# Patient Record
Sex: Female | Born: 1980 | Race: Black or African American | Hispanic: No | Marital: Single | Smoking: Never smoker
Health system: Southern US, Community
[De-identification: ages and names within clinical notes are randomized; demographics above are authoritative.]

## PROBLEM LIST (undated history)

## (undated) DIAGNOSIS — D219 Benign neoplasm of connective and other soft tissue, unspecified: Secondary | ICD-10-CM

## (undated) DIAGNOSIS — L509 Urticaria, unspecified: Secondary | ICD-10-CM

## (undated) DIAGNOSIS — T783XXA Angioneurotic edema, initial encounter: Secondary | ICD-10-CM

## (undated) DIAGNOSIS — R51 Headache: Secondary | ICD-10-CM

## (undated) DIAGNOSIS — E282 Polycystic ovarian syndrome: Secondary | ICD-10-CM

## (undated) DIAGNOSIS — R519 Headache, unspecified: Secondary | ICD-10-CM

## (undated) DIAGNOSIS — N926 Irregular menstruation, unspecified: Secondary | ICD-10-CM

## (undated) DIAGNOSIS — B009 Herpesviral infection, unspecified: Secondary | ICD-10-CM

## (undated) HISTORY — DX: Urticaria, unspecified: L50.9

## (undated) HISTORY — DX: Angioneurotic edema, initial encounter: T78.3XXA

## (undated) HISTORY — PX: NO PAST SURGERIES: SHX2092

---

## 2004-08-19 ENCOUNTER — Inpatient Hospital Stay (HOSPITAL_COMMUNITY): Admission: AD | Admit: 2004-08-19 | Discharge: 2004-08-19 | Payer: Self-pay | Admitting: Family Medicine

## 2004-08-23 ENCOUNTER — Emergency Department (HOSPITAL_COMMUNITY): Admission: EM | Admit: 2004-08-23 | Discharge: 2004-08-23 | Payer: Self-pay | Admitting: Emergency Medicine

## 2004-11-06 ENCOUNTER — Emergency Department (HOSPITAL_COMMUNITY): Admission: EM | Admit: 2004-11-06 | Discharge: 2004-11-06 | Payer: Self-pay | Admitting: Emergency Medicine

## 2013-06-25 ENCOUNTER — Encounter (HOSPITAL_COMMUNITY): Payer: Self-pay | Admitting: Emergency Medicine

## 2013-06-25 ENCOUNTER — Emergency Department (HOSPITAL_COMMUNITY)
Admission: EM | Admit: 2013-06-25 | Discharge: 2013-06-25 | Disposition: A | Discharge status: Home / Self Care | Payer: Self-pay | Attending: Emergency Medicine | Admitting: Emergency Medicine

## 2013-06-25 DIAGNOSIS — R509 Fever, unspecified: Secondary | ICD-10-CM | POA: Insufficient documentation

## 2013-06-25 DIAGNOSIS — J3489 Other specified disorders of nose and nasal sinuses: Secondary | ICD-10-CM | POA: Insufficient documentation

## 2013-06-25 DIAGNOSIS — A5901 Trichomonal vulvovaginitis: Secondary | ICD-10-CM | POA: Insufficient documentation

## 2013-06-25 DIAGNOSIS — Z3202 Encounter for pregnancy test, result negative: Secondary | ICD-10-CM | POA: Insufficient documentation

## 2013-06-25 DIAGNOSIS — A5909 Other urogenital trichomoniasis: Secondary | ICD-10-CM

## 2013-06-25 DIAGNOSIS — M6281 Muscle weakness (generalized): Secondary | ICD-10-CM | POA: Insufficient documentation

## 2013-06-25 DIAGNOSIS — IMO0001 Reserved for inherently not codable concepts without codable children: Secondary | ICD-10-CM | POA: Insufficient documentation

## 2013-06-25 DIAGNOSIS — N949 Unspecified condition associated with female genital organs and menstrual cycle: Secondary | ICD-10-CM | POA: Insufficient documentation

## 2013-06-25 DIAGNOSIS — IMO0002 Reserved for concepts with insufficient information to code with codable children: Secondary | ICD-10-CM | POA: Insufficient documentation

## 2013-06-25 DIAGNOSIS — N938 Other specified abnormal uterine and vaginal bleeding: Secondary | ICD-10-CM

## 2013-06-25 DIAGNOSIS — N760 Acute vaginitis: Secondary | ICD-10-CM | POA: Insufficient documentation

## 2013-06-25 DIAGNOSIS — Z79899 Other long term (current) drug therapy: Secondary | ICD-10-CM | POA: Insufficient documentation

## 2013-06-25 LAB — COMPREHENSIVE METABOLIC PANEL
ALT: 8 U/L (ref 0–35)
AST: 17 U/L (ref 0–37)
BUN: 7 mg/dL (ref 6–23)
CO2: 28 mEq/L (ref 19–32)
Calcium: 8.9 mg/dL (ref 8.4–10.5)
Creatinine, Ser: 0.91 mg/dL (ref 0.50–1.10)
GFR calc Af Amer: >90 mL/min (ref >90)
GFR calc non Af Amer: 84 mL/min — ABNORMAL LOW (ref >90)
Glucose, Bld: 75 mg/dL (ref 70–99)
Sodium: 138 mEq/L (ref 135–145)
Total Protein: 7.1 g/dL (ref 6.0–8.3)

## 2013-06-25 LAB — CBC WITH DIFFERENTIAL/PLATELET
Basophils Absolute: 0 10*3/uL (ref 0.0–0.1)
Eosinophils Absolute: 0 10*3/uL (ref 0.0–0.7)
Eosinophils Relative: 1 % (ref 0–5)
HCT: 36.8 % (ref 36.0–46.0)
Hemoglobin: 12.4 g/dL (ref 12.0–15.0)
Lymphocytes Relative: 55 % — ABNORMAL HIGH (ref 12–46)
Lymphs Abs: 2.5 10*3/uL (ref 0.7–4.0)
MCH: 30.2 pg (ref 26.0–34.0)
MCV: 89.8 fL (ref 78.0–100.0)
Monocytes Absolute: 0.3 10*3/uL (ref 0.1–1.0)
Monocytes Relative: 7 % (ref 3–12)
Platelets: 219 10*3/uL (ref 150–400)
RBC: 4.1 MIL/uL (ref 3.87–5.11)
WBC: 4.6 10*3/uL (ref 4.0–10.5)

## 2013-06-25 LAB — URINALYSIS, ROUTINE W REFLEX MICROSCOPIC
Bilirubin Urine: NEGATIVE
Nitrite: NEGATIVE
Protein, ur: NEGATIVE mg/dL
Urobilinogen, UA: 1 mg/dL (ref 0.0–1.0)
pH: 6.5 (ref 5.0–8.0)

## 2013-06-25 LAB — WET PREP, GENITAL
Clue Cells Wet Prep HPF POC: NONE SEEN
Yeast Wet Prep HPF POC: NONE SEEN

## 2013-06-25 LAB — URINE MICROSCOPIC-ADD ON

## 2013-06-25 LAB — LIPASE, BLOOD: Lipase: 20 U/L (ref 11–59)

## 2013-06-25 MED ORDER — METRONIDAZOLE 500 MG PO TABS
2000.0000 mg | ORAL_TABLET | Freq: Once | ORAL | Status: AC
  Administered 2013-06-25: 2000 mg via ORAL
  Filled 2013-06-25: qty 4

## 2013-06-25 MED ORDER — NORGESTIMATE-ETH ESTRADIOL 0.25-35 MG-MCG PO TABS: 1.0000 | ORAL_TABLET | Freq: Every day | ORAL | Status: DC

## 2013-06-25 NOTE — ED Notes (Addendum)
Pt began menstruating 3 weeks ago and continues to have vaginal bleeding.  Three days ago she experienced nausea, vomiting, diarrhea and chills.  She no longer has those s/s, but she is feeling dizzy and weak.  Pt also c/o lower abdominal pain she describes as cramps.

## 2013-06-25 NOTE — ED Notes (Signed)
Pt discharged.Vital signs stable and GCS 15 and discharge instruction given. 

## 2013-06-25 NOTE — ED Provider Notes (Signed)
CSN: [id number]     Arrival date & time 06/25/13  1201 History   First MD Initiated Contact with Patient 06/25/13 1526     Chief Complaint  Patient presents with  . Dizziness  . Vaginal Bleeding   (Consider location/radiation/quality/duration/timing/severity/associated sxs/prior Treatment) Patient is a 32 y.o. female presenting with vaginal bleeding.  Vaginal Bleeding Associated symptoms: dizziness and fever   Associated symptoms: no abdominal pain and no dysuria     32 y/o female here with about 3 weeks of vaginal bleeding and weakness and dizziness for 4 days. She states that her bleeding began a bit lighter occurring one to 2 pads per day and has worsened to about 4 pads per day currently. She usually has regular periods every 4 weeks lasting 4-5 days requiring only 1-2 tabs daily. She denies any trauma that may have led to this. She has dyspareunia at baseline.  She states about 4 days ago she began to develop congestion, body aches, subjective fever, intermittent dizziness, and diffuse weakness. She describes her dizziness as room spinning for moments but self resolve. There is no obvious aggravating factors such as turning her head or standing. She has had more symptoms when she is in the shower. Other symptoms have improved drastically in the last day.    History reviewed. No pertinent past medical history. History reviewed. No pertinent past surgical history. No family history on file. History  Substance Use Topics  . Smoking status: Never Smoker   . Smokeless tobacco: Not on file  . Alcohol Use: Yes     Comment: once a month   OB History   Grav Para Term Preterm Abortions TAB SAB Ect Mult Living                 Review of Systems  Constitutional: Positive for fever and chills. Negative for appetite change.  HENT: Positive for congestion. Negative for sore throat.   Respiratory: Negative for cough and shortness of breath.   Cardiovascular: Negative for chest pain.   Gastrointestinal: Negative for abdominal pain.  Genitourinary: Positive for vaginal bleeding and menstrual problem. Negative for dysuria.  Musculoskeletal: Positive for arthralgias and myalgias.  Neurological: Positive for dizziness and headaches.  All other systems reviewed and are negative.    Allergies  Review of patient's allergies indicates no known allergies.  Home Medications   Current Outpatient Rx  Name  Route  Sig  Dispense  Refill  . Pseudoeph-Doxylamine-DM-APAP (NYQUIL PO)   Oral   Take 30 mLs by mouth at bedtime as needed (cold).         . norgestimate-ethinyl estradiol (SPRINTEC 28) 0.25-35 MG-MCG tablet   Oral   Take 1 tablet by mouth daily.   1 Package   2    BP 109/63  Pulse 79  Temp(Src) 97.9 F (36.6 C) (Oral)  Resp 18  SpO2 100%  LMP 06/07/2013 Physical Exam  Nursing note and vitals reviewed. Constitutional: She is oriented to person, place, and time. She appears well-developed and well-nourished. No distress.  HENT:  Head: Normocephalic and atraumatic.  Eyes: EOM are normal. Pupils are equal, round, and reactive to light.  Neck: Neck supple.  Cardiovascular: Normal rate, regular rhythm and normal heart sounds.   No murmur heard. Pulmonary/Chest: Effort normal and breath sounds normal.  Abdominal: Soft. Bowel sounds are normal. There is no tenderness. There is no guarding.  Genitourinary: Cervix exhibits discharge. Cervix exhibits no motion tenderness. Right adnexum displays no mass, no tenderness and no  fullness. Left adnexum displays no mass, no tenderness and no fullness. No signs of injury around the vagina. No vaginal discharge found.  Musculoskeletal: She exhibits no edema.  Neurological: She is alert and oriented to person, place, and time.  Skin: Skin is warm and dry. She is not diaphoretic.  Psychiatric: She has a normal mood and affect.    ED Course  Procedures (including critical care time) Labs Review Labs Reviewed  WET PREP,  GENITAL - Abnormal; Notable for the following:    Trich, Wet Prep MODERATE (*)    WBC, Wet Prep HPF POC MODERATE (*)    All other components within normal limits  CBC WITH DIFFERENTIAL - Abnormal; Notable for the following:    Neutrophils Relative % 37 (*)    Lymphocytes Relative 55 (*)    All other components within normal limits  COMPREHENSIVE METABOLIC PANEL - Abnormal; Notable for the following:    GFR calc non Af Amer 84 (*)    All other components within normal limits  URINALYSIS, ROUTINE W REFLEX MICROSCOPIC - Abnormal; Notable for the following:    APPearance CLOUDY (*)    Specific Gravity, Urine 1.033 (*)    Hgb urine dipstick LARGE (*)    Leukocytes, UA TRACE (*)    All other components within normal limits  URINE MICROSCOPIC-ADD ON - Abnormal; Notable for the following:    Squamous Epithelial / LPF FEW (*)    Bacteria, UA FEW (*)    All other components within normal limits  URINE CULTURE  GC/CHLAMYDIA PROBE AMP  LIPASE, BLOOD  RPR  HIV ANTIBODY (ROUTINE TESTING)  POCT PREGNANCY, URINE   Imaging Review No results found.  EKG Interpretation   None       MDM   1. Trichomonal cervicitis   2. DUB (dysfunctional uterine bleeding)    31 year old female here with vaginal bleeding and flulike symptoms. Her vaginal bleeding is stable around 4 pads per day hemoglobin is 12.4. She states her flulike symptoms or are rapidly resolving he also, doing much better today than yesterday.   Evaluated with CBC, CMP, UA, lipase, wet prep and GC C. Also had an HIV and RPR for concerns of ST  Wet prep Significant for trichomonas, treated with 2 g of Flagyl here, advised treating partner also.  Started Sprintec to help control her moderate vaginal bleeding and recommended following up with GYN.  Red flags reviewed, return for worsening symptoms  Murtis Sink, MD [facility] Family Medicine Resident, PGY-2 06/25/2013, 6:42 PM         Elenora Gamma, MD 06/25/13  [phone]

## 2013-06-26 LAB — GC/CHLAMYDIA PROBE AMP
CT Probe RNA: NEGATIVE
GC Probe RNA: NEGATIVE

## 2013-06-26 LAB — HIV ANTIBODY (ROUTINE TESTING W REFLEX): HIV: NONREACTIVE

## 2013-06-27 LAB — URINE CULTURE: Colony Count: >=100000

## 2013-06-28 NOTE — Progress Notes (Signed)
ED Antimicrobial Stewardship Positive Culture Follow Up   Gabriella Smith is an 32 y.o. female who presented to [facility] on 06/25/2013 with a chief complaint of  Chief Complaint  Patient presents with  . Dizziness  . Vaginal Bleeding    Recent Results (from the past 720 hour(s))  URINE CULTURE     Status: None   Collection Time    06/25/13 12:26 PM      Result Value Range Status   Specimen Description URINE, CLEAN CATCH   Final   Special Requests NONE   Final   Culture  Setup Time     Final   Value: 06/25/2013 17:03     Performed at [facility] Count     Final   Value: >=100,000 COLONIES/ML     Performed at [facility]   Culture     Final   Value: CITROBACTER KOSERI     Performed at [facility]   Report Status 06/27/2013 FINAL   Final   Organism ID, Bacteria CITROBACTER KOSERI   Final    []  Treated with , organism resistant to prescribed antimicrobial [x]  Patient discharged originally without antimicrobial agent and treatment is now indicated  New antibiotic prescription: Ciprofloxacin 500mg  PO BID x 3 days  ED Provider: Arthor Captain, PA-C   Cleon Dew 06/28/2013, 4:12 PM Infectious Diseases Pharmacist Phone# [phone]

## 2013-07-03 NOTE — ED Provider Notes (Signed)
I saw and evaluated the patient, reviewed the resident's note and I agree with the findings and plan.   .Face to face Exam:  General:  Awake HEENT:  Atraumatic Resp:  Normal effort Abd:  Nondistended Neuro:No focal weakness  Leelah Hanna L Ladeidra Borys, MD 07/03/13 0710 

## 2013-07-07 ENCOUNTER — Encounter (HOSPITAL_COMMUNITY): Payer: Self-pay | Admitting: Emergency Medicine

## 2013-07-07 ENCOUNTER — Emergency Department (HOSPITAL_COMMUNITY)
Admission: EM | Admit: 2013-07-07 | Discharge: 2013-07-07 | Disposition: A | Discharge status: Home / Self Care | Payer: Self-pay | Attending: Emergency Medicine | Admitting: Emergency Medicine

## 2013-07-07 DIAGNOSIS — R21 Rash and other nonspecific skin eruption: Secondary | ICD-10-CM | POA: Insufficient documentation

## 2013-07-07 DIAGNOSIS — Z79899 Other long term (current) drug therapy: Secondary | ICD-10-CM | POA: Insufficient documentation

## 2013-07-07 DIAGNOSIS — W57XXXA Bitten or stung by nonvenomous insect and other nonvenomous arthropods, initial encounter: Secondary | ICD-10-CM

## 2013-07-07 NOTE — ED Provider Notes (Signed)
CSN: [id number]     Arrival date & time 07/07/13  1859 History  This chart was scribed for Gabriella Helper, PA, working with Donnetta Hutching, MD, by [facility] ED Scribe. This patient was seen in room WTR7/WTR7 and the patient's care was started at 7:31 PM.   Chief Complaint  Patient presents with  . Rash    The history is provided by the patient. No language interpreter was used.    HPI Comments: Gabriella Smith is a 32 y.o. female who presents to the Emergency Department complaining of a gradually worsening, itchy rash onset 2 days ago. She states that she stayed at a hotel the night before the rash onset. She states that the rash began on her left forearm and has gradually spread throughout her body. She states that she stayed in the hotel with another individual who did not develop a similar rash. She also reports using a new soap recently. She states that she has taken Benadryl and has applied Hydrocortisone without relief of her rash. She denies throat swelling, chest pain, trouble breathing or any other symptoms.  History reviewed. No pertinent past medical history. History reviewed. No pertinent past surgical history. No family history on file. History  Substance Use Topics  . Smoking status: Never Smoker   . Smokeless tobacco: Not on file  . Alcohol Use: Yes     Comment: once a month   OB History   Grav Para Term Preterm Abortions TAB SAB Ect Mult Living                 Review of Systems  HENT:       Denies throat swelling.  Cardiovascular: Negative for chest pain.  All other systems reviewed and are negative.   Allergies  Review of patient's allergies indicates no known allergies.  Home Medications   Current Outpatient Rx  Name  Route  Sig  Dispense  Refill  . norgestimate-ethinyl estradiol (SPRINTEC 28) 0.25-35 MG-MCG tablet   Oral   Take 1 tablet by mouth daily.   1 Package   2   . Pseudoeph-Doxylamine-DM-APAP (NYQUIL PO)   Oral   Take 30 mLs by mouth at bedtime  as needed (cold).          Triage Vitals: BP 96/66  Pulse 63  Temp(Src) 98.4 F (36.9 C) (Oral)  Resp 14  Ht 5\' 6"  (1.676 m)  Wt 152 lb (68.947 kg)  BMI 24.55 kg/m2  SpO2 100%  LMP 06/11/2013  Physical Exam  Nursing note and vitals reviewed. Constitutional: She is oriented to person, place, and time. She appears well-developed and well-nourished. No distress.  HENT:  Head: Normocephalic and atraumatic.  Normal throat exam.  Eyes: EOM are normal.  Neck: Neck supple. No tracheal deviation present.  Cardiovascular: Normal rate.   Pulmonary/Chest: Effort normal and breath sounds normal. No respiratory distress. She has no wheezes. She has no rales.  Lungs clear to auscultation.  Musculoskeletal: Normal range of motion.  Neurological: She is alert and oriented to person, place, and time.  Skin: Skin is warm and dry. Rash noted.  Pt has small, erythematous round lesions with small punctated scabs noted on left forearm, upper back, left side of neck and left shoulder. Rash is blanchable. No vesicular, pustular or petechial lesions. No rash on the palms of hands or the web spaces of fingers.  Psychiatric: She has a normal mood and affect. Her behavior is normal.    ED Course  Procedures (  including critical care time)  DIAGNOSTIC STUDIES: Oxygen Saturation is 100% on RA, normal by my interpretation.    COORDINATION OF CARE: 7:37 PM- Discussed clinical suspicion of bed bugs vs. Scabies. Pt advised of plan for treatment and pt agrees.  Labs Review Labs Reviewed - No data to display Imaging Review No results found.  EKG Interpretation   None       MDM  No diagnosis found. Patient examined by Dr. Adriana Simas       No clinical evidence of scabies or cellulitis. No antibiotics necessary at this point.  I personally performed the services described in this documentation, which was scribed in my presence. The recorded information has Smith reviewed and is accurate. Medical screening  examination/treatment/procedure(s) were conducted as a shared visit with non-physician practitioner(s) and myself.  I personally evaluated the patient during the encounter.  EKG Interpretation   None         Donnetta Hutching, MD 07/07/13 2051

## 2013-07-07 NOTE — ED Notes (Signed)
Pt states slept at a hotel 2 nights ago, the next day noticed rash to forehead, small patches on both arms, and on bottom. Pt states rash is itchy, but not painful.

## 2013-09-25 ENCOUNTER — Encounter (HOSPITAL_COMMUNITY): Payer: Self-pay

## 2013-09-25 ENCOUNTER — Inpatient Hospital Stay (HOSPITAL_COMMUNITY)
Admission: AD | Admit: 2013-09-25 | Discharge: 2013-09-25 | Disposition: A | Discharge status: Home / Self Care | Payer: Self-pay | Source: Ambulatory Visit | Attending: Obstetrics & Gynecology | Admitting: Obstetrics & Gynecology

## 2013-09-25 DIAGNOSIS — N390 Urinary tract infection, site not specified: Secondary | ICD-10-CM | POA: Insufficient documentation

## 2013-09-25 DIAGNOSIS — Z8742 Personal history of other diseases of the female genital tract: Secondary | ICD-10-CM

## 2013-09-25 DIAGNOSIS — E282 Polycystic ovarian syndrome: Secondary | ICD-10-CM | POA: Insufficient documentation

## 2013-09-25 DIAGNOSIS — N854 Malposition of uterus: Secondary | ICD-10-CM | POA: Insufficient documentation

## 2013-09-25 DIAGNOSIS — N911 Secondary amenorrhea: Secondary | ICD-10-CM

## 2013-09-25 DIAGNOSIS — N912 Amenorrhea, unspecified: Secondary | ICD-10-CM | POA: Insufficient documentation

## 2013-09-25 LAB — WET PREP, GENITAL
Trich, Wet Prep: NONE SEEN
Yeast Wet Prep HPF POC: NONE SEEN

## 2013-09-25 LAB — URINE MICROSCOPIC-ADD ON

## 2013-09-25 LAB — POCT PREGNANCY, URINE: Preg Test, Ur: NEGATIVE

## 2013-09-25 LAB — URINALYSIS, ROUTINE W REFLEX MICROSCOPIC
Bilirubin Urine: NEGATIVE
Glucose, UA: NEGATIVE mg/dL
Ketones, ur: NEGATIVE mg/dL
NITRITE: NEGATIVE
PH: 6.5 (ref 5.0–8.0)
Protein, ur: 100 mg/dL — AB
Specific Gravity, Urine: >1.03 — ABNORMAL HIGH (ref 1.005–1.030)
Urobilinogen, UA: 0.2 mg/dL (ref 0.0–1.0)

## 2013-09-25 MED ORDER — CIPROFLOXACIN HCL 500 MG PO TABS: 500.0000 mg | ORAL_TABLET | Freq: Two times a day (BID) | ORAL | Status: DC

## 2013-09-25 NOTE — Discharge Instructions (Signed)
Antibiotic Medication Antibiotics are among the most frequently prescribed medicines. Antibiotics cure illness by assisting our body to injure or kill the bacteria that cause infection. While antibiotics are useful to treat a wide variety of infections they are useless against viruses. Antibiotics cannot cure colds, flu, or other viral infections.  There are many types of antibiotics available. Your caregiver will decide which antibiotic will be useful for an illness. Never take or give someone else's antibiotics or left over medicine. Your caregiver may also take into account:  Allergies.  The cost of the medicine.  Dosing schedules.  Taste.  Common side effects when choosing an antibiotic for an infection. Ask your caregiver if you have questions about why a certain medicine was chosen. HOME CARE INSTRUCTIONS Read all instructions and labels on medicine bottles carefully. Some antibiotics should be taken on an empty stomach while others should be taken with food. Taking antibiotics incorrectly may reduce how well they work. Some antibiotics need to be kept in the refrigerator. Others should be kept at room temperature. Ask your caregiver or pharmacist if you do not understand how to give the medicine. Be sure to give the amount of medicine your caregiver has prescribed. Even if you feel better and your symptoms improve, bacteria may still remain alive in the body. Taking all of the medicine will prevent:  The infection from returning and becoming harder to treat.  Complications from partially treated infections. If there is any medicine left over after you have taken the medicine as your caregiver has instructed, throw the medicine away. Be sure to tell your caregiver if you:  Are allergic to any medicines.  Are pregnant or intend to become pregnant while using this medicine.  Are breastfeeding.  Are taking any other prescription, non-prescription medicine, or herbal  remedies.  Have any other medical conditions or problems you have not already discussed. If you are taking birth control pills, they may not work while you are on antibiotics. To avoid unwanted pregnancy:  Continue taking your birth control pills as usual.  Use a second form of birth control (such as condoms) while you are taking antibiotic medicine.  When you finish taking the antibiotic medicine, continue using the second form of birth control until you are finished with your current 1 month cycle of birth control pills. Try not to miss any doses of medicine. If you miss a dose, take it as soon as possible. However, if it is almost time for the next dose and the dosing schedule is:  2 doses a day, take the missed dose and the next dose 5 to 6 hours apart.  3 or more doses a day, take the missed dose and the next dose 2 to 4 hours apart, then go back to the normal schedule.  If you are unable to make up a missed dose, take the next scheduled dose on time and complete the missed dose at the end of the prescribed time for your medicine. SIDE EFFECTS TO TAKING ANTIBIOTICS Common side effects to antibiotic use include:  Soft stools or diarrhea.  Mild stomach upset.  Sun sensitivity. SEEK MEDICAL CARE IF:   If you get worse or do not improve within a few days of starting the medicine.  Vomiting develops.  Diaper rash or rash on the genitals appears.  Vaginal itching occurs.  White patches appear on the tongue or in the mouth.  Severe watery diarrhea and abdominal cramps occur.  Signs of an allergy develop (hives, unknown  itchy rash appears). STOP TAKING THE ANTIBIOTIC. SEEK IMMEDIATE MEDICAL CARE IF:   Urine turns dark or blood colored.  Skin turns yellow.  Easy bruising or bleeding occurs.  Joint pain or muscle aches occur.  Fever returns.  Severe headache occurs.  Signs of an allergy develop (trouble breathing, wheezing, swelling of the lips, face or tongue,  fainting, or blisters on the skin or in the mouth). STOP TAKING THE ANTIBIOTIC. Document Released: 04/03/2004 Document Revised: 10/14/2011 Document Reviewed: 04/13/2009 [facility] Patient Information 2014 [place]. Secondary Amenorrhea  Secondary amenorrhea is the stopping of menstrual flow for 3 6 months in a female who has previously had periods. There are many possible causes. Most of these causes are not serious. Usually, treating the underlying problem causing the loss of menses will return your periods to normal. CAUSES  Some common and uncommon causes of not menstruating include:  Malnutrition.  Low blood sugar (hypoglycemia).  Polycystic ovary disease.  Stress or fear.  Breastfeeding.  Hormone imbalance.  Ovarian failure.  Medicines.  Extreme obesity.  Cystic fibrosis.  Low body weight or drastic weight reduction from any cause.  Early menopause.  Removal of ovaries or uterus.  Contraceptives.  Illness.  Long-term (chronic) illnesses.  Cushing syndrome.  Thyroid problems.  Birth control pills, patches, or vaginal rings for birth control. RISK FACTORS You may be at greater risk of secondary amenorrhea if:  You have a family history of this condition.  You have an eating disorder.  You do athletic training. DIAGNOSIS  A diagnosis is made by your health care provider taking a medical history and doing a physical exam. This will include a pelvic exam to check for problems with your reproductive organs. Pregnancy must be ruled out. Often, numerous blood tests are done to measure different hormones in the body. Urine testing may be done. Specialized exams (ultrasound, CT scan, MRI, or hysteroscopy) may have to be done as well as measuring the body mass index (BMI). TREATMENT  Treatment depends on the cause of the amenorrhea. If an eating disorder is present, this can be treated with an adequate diet and therapy. Chronic illnesses may improve with  treatment of the illness. Amenorrhea may be corrected with medicines, lifestyle changes, or surgery. If the amenorrhea cannot be corrected, it is sometimes possible to create a false menstruation with medicines. HOME CARE INSTRUCTIONS  Maintain a healthy diet.  Manage weight problems.  Exercise regularly but not excessively.  Get adequate sleep.  Manage stress.  Be aware of changes in your menstrual cycle. Keep a record of when your periods occur. Note the date your period starts, how long it lasts, and any problems. SEEK MEDICAL CARE IF: Your symptoms do not get better with treatment. Document Released: 09/02/2006 Document Revised: 03/24/2013 Document Reviewed: 01/07/2013 [facility] Patient Information 2014 [place], [place]. Urinary Tract Infection Urinary tract infections (UTIs) can develop anywhere along your urinary tract. Your urinary tract is your body's drainage system for removing wastes and extra water. Your urinary tract includes two kidneys, two ureters, a bladder, and a urethra. Your kidneys are a pair of bean-shaped organs. Each kidney is about the size of your fist. They are located below your ribs, one on each side of your spine. CAUSES Infections are caused by microbes, which are microscopic organisms, including fungi, viruses, and bacteria. These organisms are so small that they can only be seen through a microscope. Bacteria are the microbes that most commonly cause UTIs. SYMPTOMS  Symptoms of UTIs may vary  by age and gender of the patient and by the location of the infection. Symptoms in young women typically include a frequent and intense urge to urinate and a painful, burning feeling in the bladder or urethra during urination. Older women and men are more likely to be tired, shaky, and weak and have muscle aches and abdominal pain. A fever may mean the infection is in your kidneys. Other symptoms of a kidney infection include pain in your back or sides below the ribs, nausea,  and vomiting. DIAGNOSIS To diagnose a UTI, your caregiver will ask you about your symptoms. Your caregiver also will ask to provide a urine sample. The urine sample will be tested for bacteria and white blood cells. White blood cells are made by your body to help fight infection. TREATMENT  Typically, UTIs can be treated with medication. Because most UTIs are caused by a bacterial infection, they usually can be treated with the use of antibiotics. The choice of antibiotic and length of treatment depend on your symptoms and the type of bacteria causing your infection. HOME CARE INSTRUCTIONS  If you were prescribed antibiotics, take them exactly as your caregiver instructs you. Finish the medication even if you feel better after you have only taken some of the medication.  Drink enough water and fluids to keep your urine clear or pale yellow.  Avoid caffeine, tea, and carbonated beverages. They tend to irritate your bladder.  Empty your bladder often. Avoid holding urine for long periods of time.  Empty your bladder before and after sexual intercourse.  After a bowel movement, women should cleanse from front to back. Use each tissue only once. SEEK MEDICAL CARE IF:   You have back pain.  You develop a fever.  Your symptoms do not begin to resolve within 3 days. SEEK IMMEDIATE MEDICAL CARE IF:   You have severe back pain or lower abdominal pain.  You develop chills.  You have nausea or vomiting.  You have continued burning or discomfort with urination. MAKE SURE YOU:   Understand these instructions.  Will watch your condition.  Will get help right away if you are not doing well or get worse. Document Released: 05/01/2005 Document Revised: 01/21/2012 Document Reviewed: 08/30/2011 [facility] Patient Information 2014 [place].

## 2013-09-25 NOTE — MAU Note (Signed)
Pt presents with concerns because she has not had a period since December. Pt complains of constant pain in lower abdomen since then. States she does not have an OB/gyn and wanted to get checked.

## 2013-09-25 NOTE — MAU Provider Note (Signed)
CC: No menses   First Provider Initiated Contact with Patient 09/25/13 1324      HPI Gabriella Smith is a 33 y.o. G0P0 who presents with initial episode of amenorrhea since 07/22/2013. She also describes pressure and fullness in lower abdomen but no abdominal pain. States she had late menarche about age 14 and regular periods until November when she had a particularly lengthy and heavy period that lasted 2 weeks and required about 4 pads per day. She was seen at [facility] emergency department for vaginal bleeding and dizziness 06/25/13. Hemoglobin was 12.4. GC and Chlamydia were negative. RPR and HIV were nonreactive. She was treated for trichomoniasis vaginitis and UTI citrobacter pansensitive tx'd with Cipro). She was given prescription for Sprintec but never filled it and does not want to take OCPs. The menses in December was normal timing, duration and flow. 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 She then went to a gynecologist in [place] in 2012 and had an ultrasound showing polycystic ovaries. Uses condoms for contraception. She's been under stress lately with her job and being in the process of moving.    History reviewed. No pertinent past medical history.  OB History  Gravida Para Term Preterm AB SAB TAB Ectopic Multiple Living  0 0                History reviewed. No pertinent past surgical history.  History   Social History  . Marital Status: Single    Spouse Name: N/A    Number of Children: N/A  . Years of Education: N/A   Occupational History  . Not on file.   Social History Main Topics  . Smoking status: Never Smoker   . Smokeless tobacco: Not on file  . Alcohol Use: Yes     Comment: once a month  . Drug Use: No  . Sexual Activity: Yes    Birth Control/ Protection: Condom   Other Topics Concern  . Not on file   Social History Narrative  . No narrative on file    No current  facility-administered medications on file prior to encounter.   Current Outpatient Prescriptions on File Prior to Encounter  Medication Sig Dispense Refill  . ciprofloxacin (CIPRO) 500 MG tablet Take 500 mg by mouth 2 (two) times daily. For 3 days. Started on 07/05/13      . diphenhydrAMINE (BENADRYL) 25 mg capsule Take 25 mg by mouth every 6 (six) hours as needed for itching.      . norgestimate-ethinyl estradiol (SPRINTEC 28) 0.25-35 MG-MCG tablet Take 1 tablet by mouth daily.  1 Package  2  . Pseudoeph-Doxylamine-DM-APAP (NYQUIL PO) Take 30 mLs by mouth at bedtime as needed (cold).        No Known Allergies  ROS Pertinent items in HPI. Denies candidate for vaginal discharge. Denies dysuria, hematuria, frequency or urgency of urination.  PHYSICAL EXAM Filed Vitals:   09/25/13 1319  BP: 110/64  Pulse: 72  Temp: 98.5 F (36.9 C)  Resp: 18   General: Well nourished, well developed female in no acute distress Skin: facial hirsutism post depilation tx Cardiovascular: Normal rate Respiratory: Normal effort Abdomen: Soft, nontender Back: No CVAT Extremities: No edema Neurologic: Alert and oriented Speculum exam: NEFG; vagina with physiologic discharge, no blood; cervix clean Bimanual exam: cervix closed, no CMT; uterus not enlarged, mid to retroverted; no adnexal tenderness or masses   LAB RESULTS Results  for orders placed during the hospital encounter of 09/25/13 (from the past 24 hour(s))  URINALYSIS, ROUTINE W REFLEX MICROSCOPIC     Status: Abnormal   Collection Time    09/25/13  1:07 PM      Result Value Ref Range   Color, Urine YELLOW  YELLOW   APPearance CLOUDY (*) CLEAR   Specific Gravity, Urine >1.030 (*) 1.005 - 1.030   pH 6.5  5.0 - 8.0   Glucose, UA NEGATIVE  NEGATIVE mg/dL   Hgb urine dipstick LARGE (*) NEGATIVE   Bilirubin Urine NEGATIVE  NEGATIVE   Ketones, ur NEGATIVE  NEGATIVE mg/dL   Protein, ur 100 (*) NEGATIVE mg/dL   Urobilinogen, UA 0.2  0.0 - 1.0  mg/dL   Nitrite NEGATIVE  NEGATIVE   Leukocytes, UA MODERATE (*) NEGATIVE  URINE MICROSCOPIC-ADD ON     Status: Abnormal   Collection Time    09/25/13  1:07 PM      Result Value Ref Range   Squamous Epithelial / LPF FEW (*) RARE   WBC, UA TOO NUMEROUS TO COUNT  <3 WBC/hpf   RBC / HPF TOO NUMEROUS TO COUNT  <3 RBC/hpf   Bacteria, UA MANY (*) RARE  POCT PREGNANCY, URINE     Status: None   Collection Time    09/25/13  1:15 PM      Result Value Ref Range   Preg Test, Ur NEGATIVE  NEGATIVE  WET PREP, GENITAL     Status: Abnormal   Collection Time    09/25/13  1:53 PM      Result Value Ref Range   Yeast Wet Prep HPF POC NONE SEEN  NONE SEEN   Trich, Wet Prep NONE SEEN  NONE SEEN   Clue Cells Wet Prep HPF POC FEW (*) NONE SEEN   WBC, Wet Prep HPF POC FEW (*) NONE SEEN    IMAGING No results found.  MAU COURSE C/W Dr. Maylene Roes Smith> will get TSH, [facility] today, to review at [facility] apponitment GC/CT sent; urine culture sent  ASSESSMENT  1. Secondary amenorrhea   2. History of PCOS   3. UTI (lower urinary tract infection)     PLAN Discharge home. See AVS for patient education. ROI 2012 [place] > result to [place] menstrual calendar.    Medication List    STOP taking these medications       norgestimate-ethinyl estradiol 0.25-35 MG-MCG tablet  Commonly known as:  SPRINTEC 28      TAKE these medications       ciprofloxacin 500 MG tablet  Commonly known as:  CIPRO  Take 1 tablet (500 mg total) by mouth 2 (two) times daily.        Follow-up Information   Follow up with [facility]. (Someone from July and clinic will call you with her and)    Specialty:  Obstetrics and Gynecology   Contact information:   [place] [address] [phone]       Lorene Dy, CNM 09/25/2013 1:25 PM

## 2013-09-25 NOTE — MAU Provider Note (Signed)
Attestation of Attending Supervision of Advanced Practitioner (CNM/NP): Evaluation and management procedures were performed by the Advanced Practitioner under my supervision and collaboration.  I have reviewed the Advanced Practitioner's note and chart, and I agree with the management and plan.  HARRAWAY-SMITH, Cherysh Epperly 4:10 PM

## 2013-09-26 LAB — TSH: TSH: 0.591 u[IU]/mL (ref 0.350–4.500)

## 2013-09-26 LAB — FOLLICLE STIMULATING HORMONE: FSH: 7.4 m[IU]/mL

## 2013-09-27 LAB — GC/CHLAMYDIA PROBE AMP
CT Probe RNA: NEGATIVE
GC Probe RNA: NEGATIVE

## 2013-11-01 ENCOUNTER — Telehealth: Payer: Self-pay | Admitting: General Practice

## 2013-11-01 ENCOUNTER — Encounter: Payer: Self-pay | Admitting: General Practice

## 2013-11-01 ENCOUNTER — Encounter: Payer: Self-pay | Admitting: Obstetrics & Gynecology

## 2013-11-01 NOTE — Telephone Encounter (Signed)
Patient no showed for appt today. Called patient, no answer- left message stating we see she missed her appt with [place] today, if she would like to reschedule please call the front office in order to do so. Will send patient letter

## 2013-11-15 ENCOUNTER — Telehealth: Payer: Self-pay

## 2013-11-15 ENCOUNTER — Encounter: Payer: Self-pay | Admitting: Obstetrics & Gynecology

## 2013-11-15 NOTE — Telephone Encounter (Signed)
Called pt. As she missed her appointment with Dr. Hulan Fray today. Pt. Stated she was called into work but was going to call to re-schedule. Pt. States her schedule is complications, advised pt. To call front desk when she has her schedule in hand so that she can schedule an appointment that works best for her but informed her the appointment may not be until the middle of may. Pt. Stated that was OK and that she will call to re-schedule soon. NO other questions or concerns to address.

## 2013-12-29 ENCOUNTER — Encounter: Payer: Self-pay | Admitting: General Practice

## 2013-12-31 ENCOUNTER — Encounter: Payer: Self-pay | Admitting: General Practice

## 2017-08-03 ENCOUNTER — Emergency Department (HOSPITAL_BASED_OUTPATIENT_CLINIC_OR_DEPARTMENT_OTHER): Payer: Self-pay

## 2017-08-03 ENCOUNTER — Other Ambulatory Visit: Payer: Self-pay

## 2017-08-03 ENCOUNTER — Encounter (HOSPITAL_BASED_OUTPATIENT_CLINIC_OR_DEPARTMENT_OTHER): Payer: Self-pay | Admitting: Emergency Medicine

## 2017-08-03 ENCOUNTER — Emergency Department (HOSPITAL_BASED_OUTPATIENT_CLINIC_OR_DEPARTMENT_OTHER)
Admission: EM | Admit: 2017-08-03 | Discharge: 2017-08-04 | Disposition: A | Discharge status: Home / Self Care | Payer: Self-pay | Attending: Emergency Medicine | Admitting: Emergency Medicine

## 2017-08-03 DIAGNOSIS — N3091 Cystitis, unspecified with hematuria: Secondary | ICD-10-CM | POA: Insufficient documentation

## 2017-08-03 DIAGNOSIS — N3000 Acute cystitis without hematuria: Secondary | ICD-10-CM

## 2017-08-03 DIAGNOSIS — J069 Acute upper respiratory infection, unspecified: Secondary | ICD-10-CM | POA: Insufficient documentation

## 2017-08-03 DIAGNOSIS — Z79899 Other long term (current) drug therapy: Secondary | ICD-10-CM | POA: Insufficient documentation

## 2017-08-03 MED ORDER — ACETAMINOPHEN 325 MG PO TABS
650.0000 mg | ORAL_TABLET | Freq: Once | ORAL | Status: AC | PRN
  Administered 2017-08-03: 650 mg via ORAL
  Filled 2017-08-03: qty 2

## 2017-08-03 MED ORDER — SODIUM CHLORIDE 0.9 % IV BOLUS (SEPSIS)
1000.0000 mL | Freq: Once | INTRAVENOUS | Status: AC
  Administered 2017-08-03: 1000 mL via INTRAVENOUS

## 2017-08-03 NOTE — ED Provider Notes (Signed)
[facility] EMERGENCY DEPARTMENT Provider Note   CSN: [id number] Arrival date & time: 08/03/17  1904     History   Chief Complaint Chief Complaint  Patient presents with  . Cough    HPI Gabriella Smith is a 36 y.o. female.  HPI Pt has been having trouble with cough and congestion for the last several days.  She has had a poor appetite and has been dry heaving.  NO vomiting or diarrhea.  She has been having fevers that come and go.  She is having body aches. Some back pain.  No dysuria.  No flu shot this year.    History reviewed. No pertinent past medical history.  There are no active problems to display for this patient.   History reviewed. No pertinent surgical history.  OB History    Gravida Para Term Preterm AB Living   0 0           SAB TAB Ectopic Multiple Live Births                   Home Medications    Prior to Admission medications   Medication Sig Start Date End Date Taking? Authorizing Provider  ciprofloxacin (CIPRO) 500 MG tablet Take 1 tablet (500 mg total) by mouth 2 (two) times daily. 09/25/13   Poe, Deirdre Loletha Grayer, CNM    Family History History reviewed. No pertinent family history.  Social History Social History   Tobacco Use  . Smoking status: Never Smoker  Substance Use Topics  . Alcohol use: Yes    Comment: once a month  . Drug use: No     Allergies   Patient has no known allergies.   Review of Systems Review of Systems  All other systems reviewed and are negative.    Physical Exam Updated Vital Signs BP 109/64 (BP Location: Right Arm)   Pulse 97   Temp 98.6 F (37 C) (Oral)   Resp 19   Ht 1.676 m (5\' 6" )   Wt 65.8 kg (145 lb)   SpO2 100%   BMI 23.40 kg/m   Physical Exam  Constitutional: She appears well-developed and well-nourished. No distress.  HENT:  Head: Normocephalic and atraumatic.  Right Ear: External ear normal.  Left Ear: External ear normal.  Nasal congestion  Eyes: Conjunctivae are  normal. Right eye exhibits no discharge. Left eye exhibits no discharge. No scleral icterus.  Neck: Neck supple. No tracheal deviation present.  Cardiovascular: Normal rate, regular rhythm and intact distal pulses.  Pulmonary/Chest: Effort normal and breath sounds normal. No stridor. No respiratory distress. She has no wheezes. She has no rales.  Frequent coughing  Abdominal: Soft. Bowel sounds are normal. She exhibits no distension. There is no tenderness. There is no rebound and no guarding.  Musculoskeletal: She exhibits no edema or tenderness.  Neurological: She is alert. She has normal strength. No cranial nerve deficit (no facial droop, extraocular movements intact, no slurred speech) or sensory deficit. She exhibits normal muscle tone. She displays no seizure activity. Coordination normal.  Skin: Skin is warm and dry. No rash noted.  Psychiatric: She has a normal mood and affect.  Nursing note and vitals reviewed.    ED Treatments / Results  Labs (all labs ordered are listed, but only abnormal results are displayed) Labs Reviewed  URINALYSIS, ROUTINE W REFLEX MICROSCOPIC  PREGNANCY, URINE  INFLUENZA PANEL BY PCR (TYPE A & B)    Radiology Dg Chest 2 View  Result Date:  08/03/2017 CLINICAL DATA:  36 y/o F; body aches, headache, fever, cough, chills. EXAM: CHEST  2 VIEW COMPARISON:  None. FINDINGS: The heart size and mediastinal contours are within normal limits. Both lungs are clear. The visualized skeletal structures are unremarkable. IMPRESSION: No active cardiopulmonary disease. Electronically Signed   By: Kristine Garbe M.D.   On: 08/03/2017 22:38    Procedures Procedures (including critical care time)  Medications Ordered in ED Medications  acetaminophen (TYLENOL) tablet 650 mg (650 mg Oral Given 08/03/17 1914)  sodium chloride 0.9 % bolus 1,000 mL (1,000 mLs Intravenous New Bag/Given 08/03/17 2252)     Initial Impression / Assessment and Plan / ED Course    I have reviewed the triage vital signs and the nursing notes.  Pertinent labs & imaging results that were available during my care of the patient were reviewed by me and considered in my medical decision making (see chart for details).   Pt presents with URI, flu symptoms.  No pna on cxr.  Will check ua  And influenza.  May be a viral illness.  Will ask Dr Randal Buba to follow up on the result.  Final Clinical Impressions(s) / ED Diagnoses  pending   Dorie Rank, MD 08/03/17 2352

## 2017-08-03 NOTE — ED Triage Notes (Signed)
Patient reports fever and cough since Thursday night.  Reports coughing up dark phlegm.

## 2017-08-03 NOTE — ED Notes (Signed)
Pt given cup to attempt urine sample. 

## 2017-08-04 LAB — URINALYSIS, MICROSCOPIC (REFLEX)

## 2017-08-04 LAB — URINALYSIS, ROUTINE W REFLEX MICROSCOPIC
Glucose, UA: NEGATIVE mg/dL
LEUKOCYTES UA: NEGATIVE
Nitrite: POSITIVE — AB
Specific Gravity, Urine: 1.025 (ref 1.005–1.030)
pH: 6.5 (ref 5.0–8.0)

## 2017-08-04 LAB — PREGNANCY, URINE: Preg Test, Ur: NEGATIVE

## 2017-08-04 LAB — INFLUENZA PANEL BY PCR (TYPE A & B)
INFLBPCR: NEGATIVE
Influenza A By PCR: POSITIVE — AB

## 2017-08-04 MED ORDER — ACETAMINOPHEN 325 MG PO TABS
650.0000 mg | ORAL_TABLET | Freq: Once | ORAL | Status: AC
  Administered 2017-08-04: 650 mg via ORAL
  Filled 2017-08-04: qty 2

## 2017-08-04 MED ORDER — IBUPROFEN 400 MG PO TABS
600.0000 mg | ORAL_TABLET | Freq: Once | ORAL | Status: AC
  Administered 2017-08-04: 600 mg via ORAL
  Filled 2017-08-04: qty 1

## 2017-08-04 MED ORDER — NITROFURANTOIN MONOHYD MACRO 100 MG PO CAPS: 100.0000 mg | ORAL_CAPSULE | Freq: Two times a day (BID) | ORAL | 0 refills | Status: DC

## 2018-08-18 ENCOUNTER — Inpatient Hospital Stay (HOSPITAL_COMMUNITY)
Admission: AD | Admit: 2018-08-18 | Discharge: 2018-08-18 | Disposition: A | Discharge status: Home / Self Care | Payer: Self-pay | Attending: Obstetrics & Gynecology | Admitting: Obstetrics & Gynecology

## 2018-08-18 ENCOUNTER — Encounter (HOSPITAL_COMMUNITY): Payer: Self-pay

## 2018-08-18 DIAGNOSIS — Z3202 Encounter for pregnancy test, result negative: Secondary | ICD-10-CM | POA: Insufficient documentation

## 2018-08-18 DIAGNOSIS — N939 Abnormal uterine and vaginal bleeding, unspecified: Secondary | ICD-10-CM | POA: Insufficient documentation

## 2018-08-18 HISTORY — DX: Polycystic ovarian syndrome: E28.2

## 2018-08-18 HISTORY — DX: Benign neoplasm of connective and other soft tissue, unspecified: D21.9

## 2018-08-18 HISTORY — DX: Irregular menstruation, unspecified: N92.6

## 2018-08-18 HISTORY — DX: Headache: R51

## 2018-08-18 HISTORY — DX: Headache, unspecified: R51.9

## 2018-08-18 HISTORY — DX: Herpesviral infection, unspecified: B00.9

## 2018-08-18 LAB — POCT PREGNANCY, URINE: PREG TEST UR: NEGATIVE

## 2018-08-18 LAB — CBC WITH DIFFERENTIAL/PLATELET
Basophils Absolute: 0 10*3/uL (ref 0.0–0.1)
Basophils Relative: 0 %
EOS ABS: 0.1 10*3/uL (ref 0.0–0.5)
Eosinophils Relative: 1 %
HCT: 36.5 % (ref 36.0–46.0)
HEMOGLOBIN: 11.9 g/dL — AB (ref 12.0–15.0)
LYMPHS ABS: 2.3 10*3/uL (ref 0.7–4.0)
Lymphocytes Relative: 48 %
MCH: 29.5 pg (ref 26.0–34.0)
MCHC: 32.6 g/dL (ref 30.0–36.0)
MCV: 90.3 fL (ref 80.0–100.0)
Monocytes Absolute: 0.3 10*3/uL (ref 0.1–1.0)
Monocytes Relative: 5 %
NEUTROS PCT: 46 %
Neutro Abs: 2.2 10*3/uL (ref 1.7–7.7)
Platelets: 213 10*3/uL (ref 150–400)
RBC: 4.04 MIL/uL (ref 3.87–5.11)
RDW: 13.7 % (ref 11.5–15.5)
WBC: 4.8 10*3/uL (ref 4.0–10.5)
nRBC: 0 % (ref 0.0–0.2)

## 2018-08-18 LAB — URINALYSIS, ROUTINE W REFLEX MICROSCOPIC

## 2018-08-18 LAB — URINALYSIS, MICROSCOPIC (REFLEX): RBC / HPF: >50 RBC/hpf (ref 0–5)

## 2018-08-18 LAB — WET PREP, GENITAL
Clue Cells Wet Prep HPF POC: NONE SEEN
Sperm: NONE SEEN
TRICH WET PREP: NONE SEEN
YEAST WET PREP: NONE SEEN

## 2018-08-18 NOTE — MAU Note (Signed)
Pt has hx of irregular periods, Had some spotting in late December and started having heavier bleeding the 2nd of January and still heavy bleeding, soaking 1-2 pads an hour. Having cramping.

## 2018-08-18 NOTE — MAU Provider Note (Signed)
History     CSN: [id number]  Arrival date and time: 08/18/18 2951   First Provider Initiated Contact with Patient 08/18/18 [phone]      Chief Complaint  Patient presents with  . Vaginal Bleeding   Gabriella Smith is a 38 y.o. G0P0 who presents today with vaginal bleeding. She reports a history of irregualr periods, but they have been more regular in the last year. She states that on 12/28 she started spotting. Then on 12/30 she had intercourse, and bleeding increased some at that time. On 1/2  She started to have flow like a period, and has been bleeding like that since then.   Vaginal Bleeding  The patient's primary symptoms include vaginal bleeding. The patient's pertinent negatives include no pelvic pain. The current episode started 1 to 4 weeks ago. The problem occurs constantly. The problem has been gradually worsening. The patient is experiencing no pain. She is not pregnant. The vaginal discharge was bloody. The vaginal bleeding is typical of menses. She has been passing clots. She has not been passing tissue. Nothing aggravates the symptoms. She has tried nothing for the symptoms. She is sexually active. She uses nothing for contraception. Her menstrual history has been irregular (Most of the time they are fairy regular, but has had some irregular periods in the last year. ).    OB History    Gravida  0   Para  0   Term      Preterm      AB      Living        SAB      TAB      Ectopic      Multiple      Live Births              Past Medical History:  Diagnosis Date  . Fibroid   . Headache   . Herpes   . Irregular periods   . PCOS (polycystic ovarian syndrome)     Past Surgical History:  Procedure Laterality Date  . NO PAST SURGERIES      No family history on file.  Social History   Tobacco Use  . Smoking status: Never Smoker  . Smokeless tobacco: Never Used  Substance Use Topics  . Alcohol use: Yes    Comment: once a month  . Drug use: No     Allergies:  Allergies  Allergen Reactions  . Erythromycin Hives and Nausea And Vomiting    Got in IV and had a reactions. Has taken other antibiotics without a problem    Medications Prior to Admission  Medication Sig Dispense Refill Last Dose  . ciprofloxacin (CIPRO) 500 MG tablet Take 1 tablet (500 mg total) by mouth 2 (two) times daily. 20 tablet 0   . nitrofurantoin, macrocrystal-monohydrate, (MACROBID) 100 MG capsule Take 1 capsule (100 mg total) by mouth 2 (two) times daily. X 7 days 14 capsule 0     Review of Systems  Genitourinary: Positive for vaginal bleeding. Negative for pelvic pain.   Physical Exam   Blood pressure 109/66, pulse 63, temperature 97.8 F (36.6 C), temperature source Oral, resp. rate 16, height 5\' 7"  (1.702 m), weight 78.4 kg, last menstrual period 08/01/2018, SpO2 100 %.  Physical Exam  Nursing note and vitals reviewed. Constitutional: She is oriented to person, place, and time. She appears well-developed and well-nourished. No distress.  HENT:  Head: Normocephalic.  Cardiovascular: Normal rate.  Respiratory: Effort normal.  GI: Soft.  There is no abdominal tenderness. There is no rebound.  Genitourinary:    Genitourinary Comments:  External: no lesion Vagina: small amount of blood seen  Cervix: pink, smooth, no CMT Uterus: NSSC Adnexa: NT    Neurological: She is alert and oriented to person, place, and time.  Skin: Skin is warm and dry.  Psychiatric: She has a normal mood and affect.     Results for orders placed or performed during the hospital encounter of 08/18/18 (from the past 24 hour(s))  Urinalysis, Routine w reflex microscopic     Status: Abnormal   Collection Time: 08/18/18  9:22 AM  Result Value Ref Range   Color, Urine RED (A) YELLOW   APPearance TURBID (A) CLEAR   Specific Gravity, Urine  1.005 - 1.030    TEST NOT REPORTED DUE TO COLOR INTERFERENCE OF URINE PIGMENT   pH  5.0 - 8.0    TEST NOT REPORTED DUE TO COLOR  INTERFERENCE OF URINE PIGMENT   Glucose, UA (A) NEGATIVE mg/dL    TEST NOT REPORTED DUE TO COLOR INTERFERENCE OF URINE PIGMENT   Hgb urine dipstick (A) NEGATIVE    TEST NOT REPORTED DUE TO COLOR INTERFERENCE OF URINE PIGMENT   Bilirubin Urine (A) NEGATIVE    TEST NOT REPORTED DUE TO COLOR INTERFERENCE OF URINE PIGMENT   Ketones, ur (A) NEGATIVE mg/dL    TEST NOT REPORTED DUE TO COLOR INTERFERENCE OF URINE PIGMENT   Protein, ur (A) NEGATIVE mg/dL    TEST NOT REPORTED DUE TO COLOR INTERFERENCE OF URINE PIGMENT   Nitrite (A) NEGATIVE    TEST NOT REPORTED DUE TO COLOR INTERFERENCE OF URINE PIGMENT   Leukocytes, UA (A) NEGATIVE    TEST NOT REPORTED DUE TO COLOR INTERFERENCE OF URINE PIGMENT  Urinalysis, Microscopic (reflex)     Status: Abnormal   Collection Time: 08/18/18  9:22 AM  Result Value Ref Range   RBC / HPF >50 0 - 5 RBC/hpf   WBC, UA 0-5 0 - 5 WBC/hpf   Bacteria, UA FEW (A) NONE SEEN   Squamous Epithelial / LPF 0-5 0 - 5  Pregnancy, urine POC     Status: None   Collection Time: 08/18/18  9:45 AM  Result Value Ref Range   Preg Test, Ur NEGATIVE NEGATIVE  Wet prep, genital     Status: Abnormal   Collection Time: 08/18/18 10:06 AM  Result Value Ref Range   Yeast Wet Prep HPF POC NONE SEEN NONE SEEN   Trich, Wet Prep NONE SEEN NONE SEEN   Clue Cells Wet Prep HPF POC NONE SEEN NONE SEEN   WBC, Wet Prep HPF POC FEW (A) NONE SEEN   Sperm NONE SEEN   CBC with Differential/Platelet     Status: Abnormal (Preliminary result)   Collection Time: 08/18/18 10:13 AM  Result Value Ref Range   WBC 4.8 4.0 - 10.5 K/uL   RBC 4.04 3.87 - 5.11 MIL/uL   Hemoglobin 11.9 (L) 12.0 - 15.0 g/dL   HCT 36.5 36.0 - 46.0 %   MCV 90.3 80.0 - 100.0 fL   MCH 29.5 26.0 - 34.0 pg   MCHC 32.6 30.0 - 36.0 g/dL   RDW 13.7 11.5 - 15.5 %   Platelets 213 150 - 400 K/uL   nRBC 0.0 0.0 - 0.2 %   Neutrophils Relative % 46 %   Neutro Abs 2.2 1.7 - 7.7 K/uL   Lymphocytes Relative 48 %   Lymphs Abs 2.3 0.7 -  4.0 K/uL   Monocytes Relative 5 %   Monocytes Absolute 0.3 0.1 - 1.0 K/uL   Eosinophils Relative 1 %   Eosinophils Absolute 0.1 0.0 - 0.5 K/uL   Basophils Relative 0 %   Basophils Absolute 0.0 0.0 - 0.1 K/uL   Other PENDING %    MAU Course  Procedures  MDM   Assessment and Plan   1. Abnormal uterine bleeding    DC home Comfort measures reviewed  Bleeding precautions RX: outpatient [place] ordered  Return to MAU as needed Message sent clinic for patient to schedule a FU visit.   Follow-up [place] for [place] Follow up.   Specialty:  Obstetrics and Gynecology Contact information: [place], CNM  08/18/18  10:44 AM

## 2018-08-18 NOTE — Discharge Instructions (Signed)
Abnormal Uterine Bleeding  Abnormal uterine bleeding means bleeding more than usual from your uterus. It can include:   Bleeding between periods.   Bleeding after sex.   Bleeding that is heavier than normal.   Periods that last longer than usual.   Bleeding after you have stopped having your period (menopause).  There are many problems that may cause this. You should see a doctor for any kind of bleeding that is not normal. Treatment depends on the cause of the bleeding.  Follow these instructions at home:   Watch your condition for any changes.   Do not use tampons, douche, or have sex, if your doctor tells you not to.   Change your pads often.   Get regular well-woman exams. Make sure they include a pelvic exam and cervical cancer screening.   Keep all follow-up visits as told by your doctor. This is important.  Contact a doctor if:   The bleeding lasts more than one week.   You feel dizzy at times.   You feel like you are going to throw up (nauseous).   You throw up.  Get help right away if:   You pass out.   You have to change pads every hour.   You have belly (abdominal) pain.   You have a fever.   You get sweaty.   You get weak.   You passing large blood clots from your vagina.  Summary   Abnormal uterine bleeding means bleeding more than usual from your uterus.   There are many problems that may cause this. You should see a doctor for any kind of bleeding that is not normal.   Treatment depends on the cause of the bleeding.  This information is not intended to replace advice given to you by your health care provider. Make sure you discuss any questions you have with your health care provider.  Document Released: 05/19/2009 Document Revised: 07/16/2016 Document Reviewed: 07/16/2016  Elsevier Interactive Patient Education  2019 [employer]

## 2018-08-19 LAB — GC/CHLAMYDIA PROBE AMP (~~LOC~~) NOT AT ARMC
Chlamydia: NEGATIVE
Neisseria Gonorrhea: NEGATIVE

## 2018-08-20 ENCOUNTER — Telehealth: Payer: Self-pay | Admitting: Obstetrics and Gynecology

## 2018-08-20 NOTE — Telephone Encounter (Signed)
Informed patient of upcoming appointment. Stated she would attend.

## 2018-08-24 ENCOUNTER — Ambulatory Visit (HOSPITAL_COMMUNITY): Payer: Self-pay

## 2018-08-24 ENCOUNTER — Ambulatory Visit (HOSPITAL_COMMUNITY)
Admission: RE | Admit: 2018-08-24 | Discharge: 2018-08-24 | Disposition: A | Discharge status: Home / Self Care | Payer: Self-pay | Source: Ambulatory Visit | Attending: Advanced Practice Midwife | Admitting: Advanced Practice Midwife

## 2018-08-24 DIAGNOSIS — N939 Abnormal uterine and vaginal bleeding, unspecified: Secondary | ICD-10-CM | POA: Insufficient documentation

## 2018-08-28 ENCOUNTER — Encounter: Payer: Self-pay | Admitting: Obstetrics & Gynecology

## 2018-08-28 ENCOUNTER — Ambulatory Visit (INDEPENDENT_AMBULATORY_CARE_PROVIDER_SITE_OTHER): Payer: Self-pay | Admitting: Obstetrics & Gynecology

## 2018-08-28 VITALS — BP 104/70 | HR 72 | Wt 176.0 lb

## 2018-08-28 DIAGNOSIS — N938 Other specified abnormal uterine and vaginal bleeding: Secondary | ICD-10-CM

## 2018-08-28 NOTE — Patient Instructions (Signed)
Abnormal Uterine Bleeding  Abnormal uterine bleeding means bleeding more than usual from your uterus. It can include:   Bleeding between periods.   Bleeding after sex.   Bleeding that is heavier than normal.   Periods that last longer than usual.   Bleeding after you have stopped having your period (menopause).  There are many problems that may cause this. You should see a doctor for any kind of bleeding that is not normal. Treatment depends on the cause of the bleeding.  Follow these instructions at home:   Watch your condition for any changes.   Do not use tampons, douche, or have sex, if your doctor tells you not to.   Change your pads often.   Get regular well-woman exams. Make sure they include a pelvic exam and cervical cancer screening.   Keep all follow-up visits as told by your doctor. This is important.  Contact a doctor if:   The bleeding lasts more than one week.   You feel dizzy at times.   You feel like you are going to throw up (nauseous).   You throw up.  Get help right away if:   You pass out.   You have to change pads every hour.   You have belly (abdominal) pain.   You have a fever.   You get sweaty.   You get weak.   You passing large blood clots from your vagina.  Summary   Abnormal uterine bleeding means bleeding more than usual from your uterus.   There are many problems that may cause this. You should see a doctor for any kind of bleeding that is not normal.   Treatment depends on the cause of the bleeding.  This information is not intended to replace advice given to you by your health care provider. Make sure you discuss any questions you have with your health care provider.  Document Released: 05/19/2009 Document Revised: 07/16/2016 Document Reviewed: 07/16/2016  Elsevier Interactive Patient Education  2019 [employer]

## 2018-08-28 NOTE — Progress Notes (Signed)
Patient ID: Gabriella Smith, female   DOB: [birth year], 38 y.o.   MRN: [id number]  Chief Complaint  Patient presents with  . abnormal bleeding    HPI Gabriella Smith is a 38 y.o. female.  G0P0 Patient's last menstrual period was 08/01/2018. She presented to [facility] 08/18/18  with vaginal bleeding. She reports a history of irregular periods, but they have been more regular in the last year. She states that on 12/28 she started spotting. Then on 12/30 she had intercourse, and bleeding increased some at that time. On 1/2  She started to have flow like a period,and the bleeding stopped a few days ago. She has had nl gyn care in [place] and her pap was nl. Up until this episode her periods were regular and norma. HPI  Past Medical History:  Diagnosis Date  . Fibroid   . Headache   . Herpes   . Irregular periods   . PCOS (polycystic ovarian syndrome)     Past Surgical History:  Procedure Laterality Date  . NO PAST SURGERIES      No family history on file.  Social History Social History   Tobacco Use  . Smoking status: Never Smoker  . Smokeless tobacco: Never Used  Substance Use Topics  . Alcohol use: Yes    Comment: once a month  . Drug use: No    Allergies  Allergen Reactions  . Erythromycin Hives and Nausea And Vomiting    Got in IV and had a reactions. Has taken other antibiotics without a problem    No current outpatient medications on file.   No current facility-administered medications for this visit.     Review of Systems Review of Systems  Constitutional: Negative.   Gastrointestinal: Negative.   Genitourinary: Positive for menstrual problem. Negative for pelvic pain, vaginal bleeding and vaginal discharge.    Blood pressure 104/70, pulse 72, weight 176 lb (79.8 kg), last menstrual period 08/01/2018.  Physical Exam Physical Exam  Data Reviewed CLINICAL DATA:  Abnormal uterine bleeding.  EXAM: TRANSABDOMINAL AND TRANSVAGINAL ULTRASOUND OF  PELVIS  TECHNIQUE: Both transabdominal and transvaginal ultrasound examinations of the pelvis were performed. Transabdominal technique was performed for global imaging of the pelvis including uterus, ovaries, adnexal regions, and pelvic cul-de-sac. It was necessary to proceed with endovaginal exam following the transabdominal exam to visualize the uterus, endometrium and ovaries.  COMPARISON:  None  FINDINGS: Uterus  Measurements: 8.5 x 3.4 x 4.7 cm = volume: 70.6 mL. No fibroids or other mass visualized.  Endometrium  Thickness: 7.0.  No focal abnormality visualized.  Right ovary  Measurements: 3.95 x 2.60 x 2.65 cm = volume: 14.21 mL. Normal appearance/no adnexal mass.  Left ovary  Measurements: 3.8 x 3.22 x 3.70 cm = volume: 24.26 mL. Normal appearance/no adnexal mass.  Other findings  No abnormal free fluid.  IMPRESSION: 1. No focal endometrial abnormality or abnormal thickening noted. If bleeding remains unresponsive to hormonal or medical therapy, sonohysterogram should be considered for focal lesion work-up. (Ref: Radiological Reasoning: Algorithmic Workup of Abnormal Vaginal Bleeding with Endovaginal Sonography and Sonohysterography. AJR 2008; 202:R42-70) 2. Bilateral ovarian enlargement. Per 2003 consensus conference statement by the [place] for Human Reproduction and Embryology and the [place] [employer] for Reproductive Medicine, findings meet ultrasound diagnostic criteria for PCOS.   Electronically Signed   By: Kerby Moors M.D.   On: 08/24/2018 14:36  CBC    Component Value Date/Time   WBC 4.8 08/18/2018 1013   RBC 4.04 08/18/2018  1013   HGB 11.9 (L) 08/18/2018 1013   HCT 36.5 08/18/2018 1013   PLT 213 08/18/2018 1013   MCV 90.3 08/18/2018 1013   MCH 29.5 08/18/2018 1013   MCHC 32.6 08/18/2018 1013   RDW 13.7 08/18/2018 1013   LYMPHSABS 2.3 08/18/2018 1013   MONOABS 0.3 08/18/2018 1013   EOSABS 0.1 08/18/2018  1013   BASOSABS 0.0 08/18/2018 1013     Assessment     recent episode of DUB which resolved    Plan    Continue routine Gyn care Report if her menses become more irregular and heavy       Emeterio Reeve 08/28/2018, 11:04 AM

## 2019-04-29 IMAGING — DX DG CHEST 2V
2 series · 2 of 2 positions shown · non-contrast
Comparison: None.

CLINICAL DATA: 36 y/o F; body aches, headache, fever, cough,
chills.

EXAM:
CHEST  2 VIEW

[chest pa]
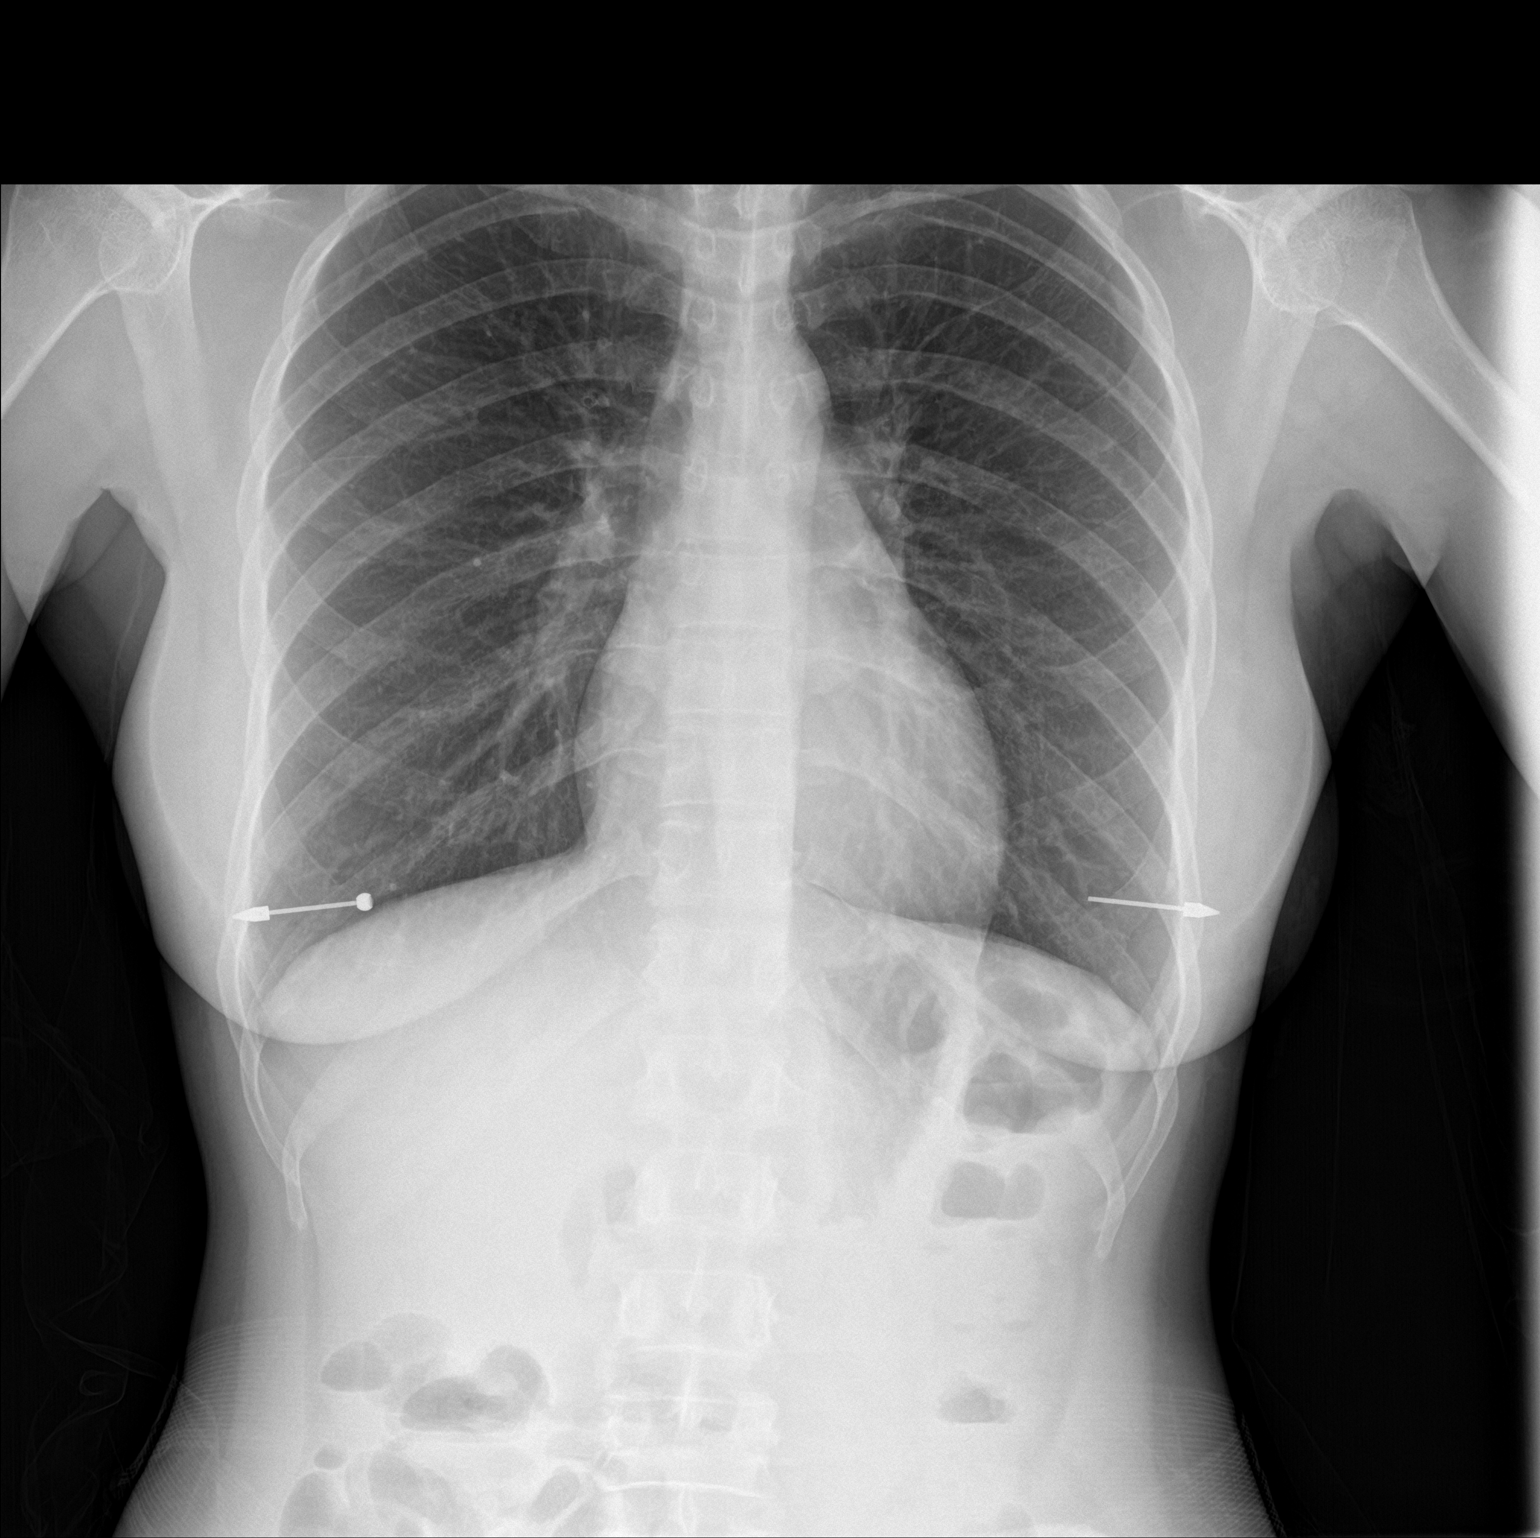

[chest lat]
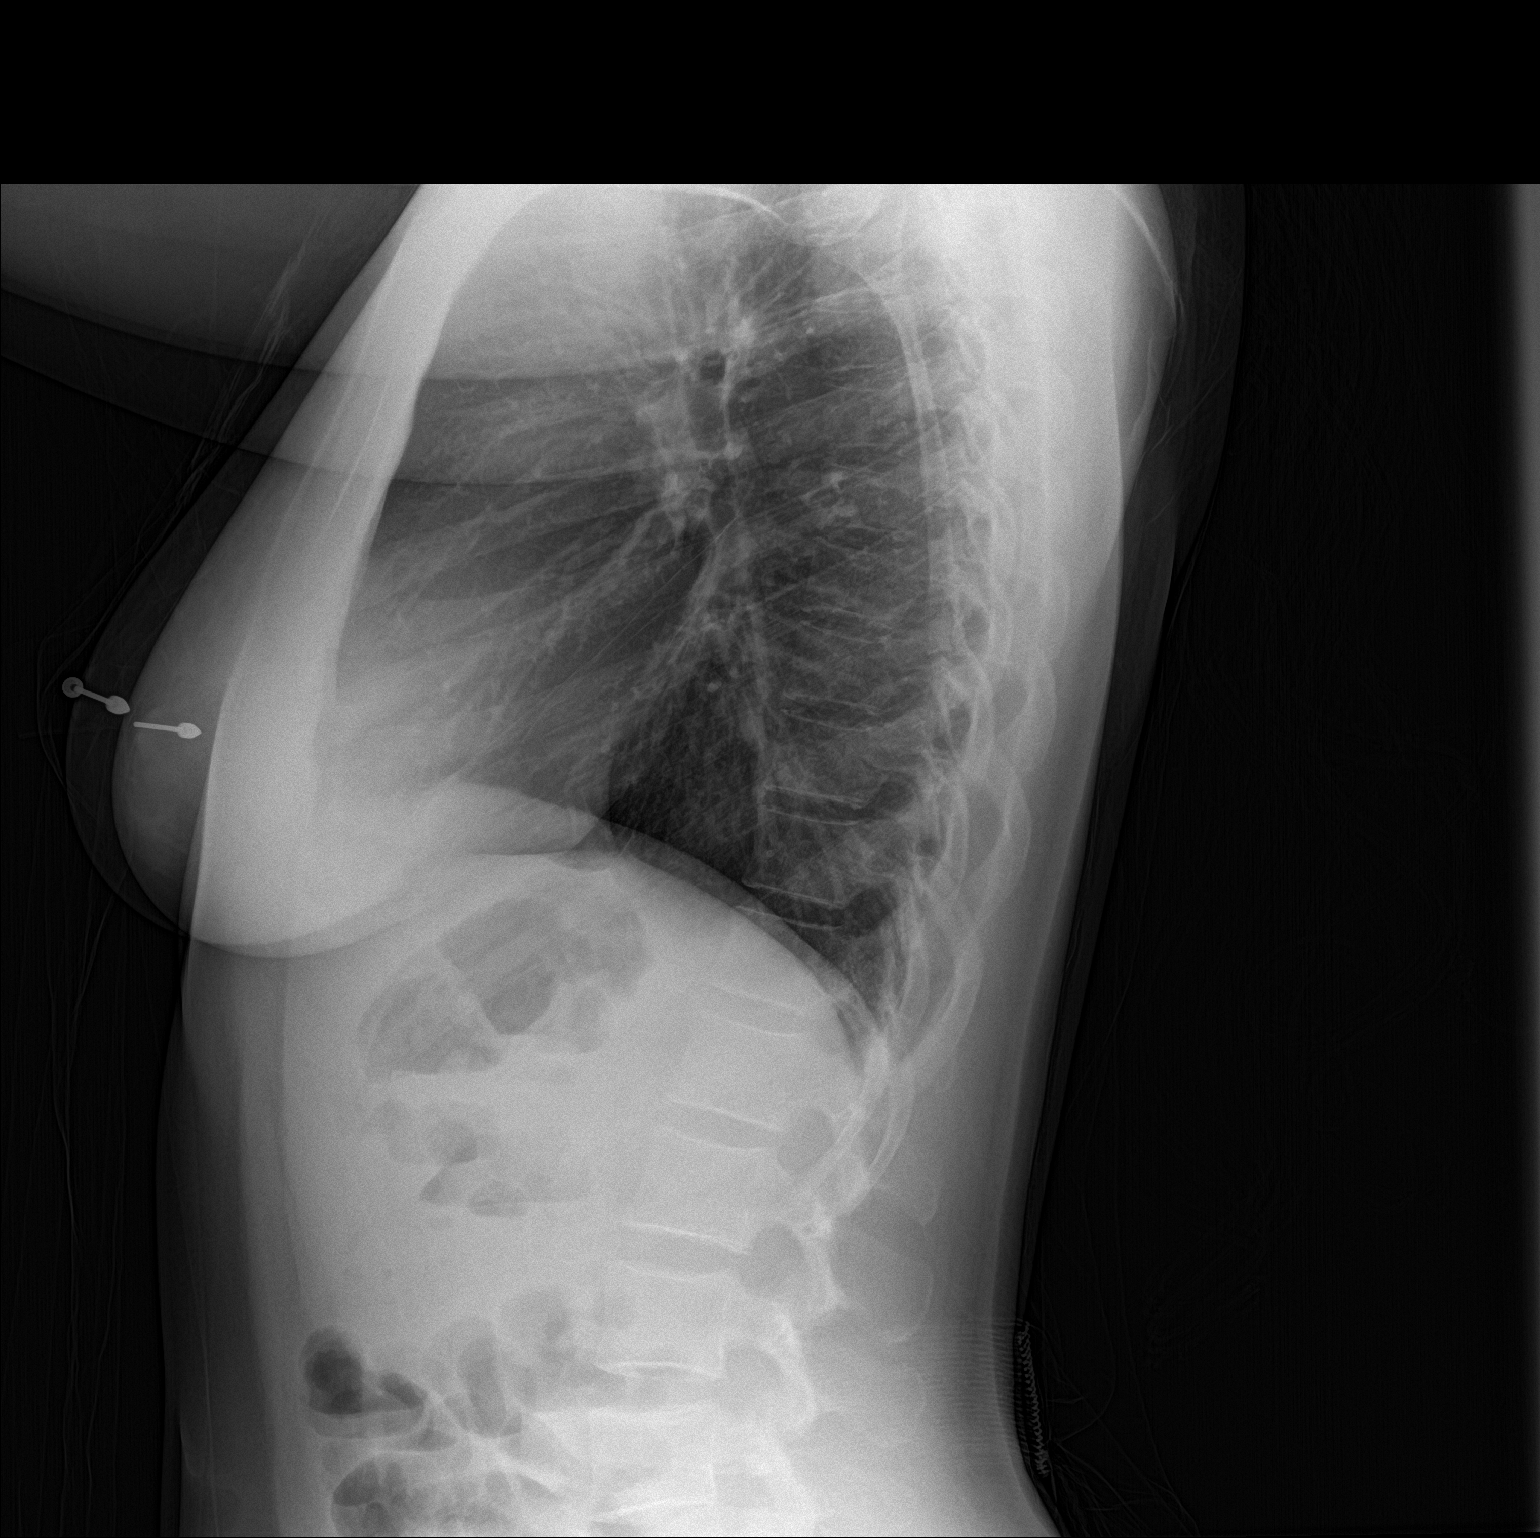

[2 of 2 positions shown; findings below may reference images not displayed]

FINDINGS: The heart size and mediastinal contours are within normal limits.
Both lungs are clear. The visualized skeletal structures are
unremarkable.
IMPRESSION: No active cardiopulmonary disease.

By: Hly Dwivedi M.D.

## 2020-05-19 IMAGING — US US PELVIS COMPLETE WITH TRANSVAGINAL
1 series · 15 of 25 positions shown · non-contrast
Comparison: None

CLINICAL DATA: Abnormal uterine bleeding.

EXAM:
TRANSABDOMINAL AND TRANSVAGINAL ULTRASOUND OF PELVIS
TECHNIQUE: Both transabdominal and transvaginal ultrasound examinations of the
pelvis were performed. Transabdominal technique was performed for
global imaging of the pelvis including uterus, ovaries, adnexal
regions, and pelvic cul-de-sac. It was necessary to proceed with
endovaginal exam following the transabdominal exam to visualize the
uterus, endometrium and ovaries.

[Series 1: us pelvis complete with transvaginal · 53 acquisitions, 15 frames shown]
[im 1/53]
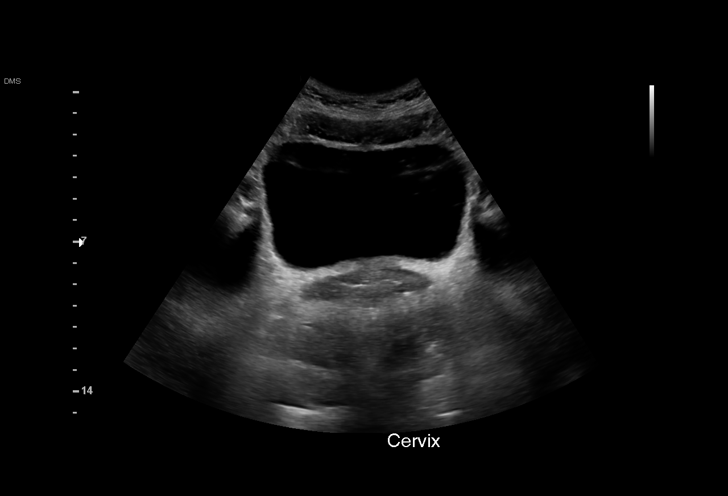
[im 5/53]
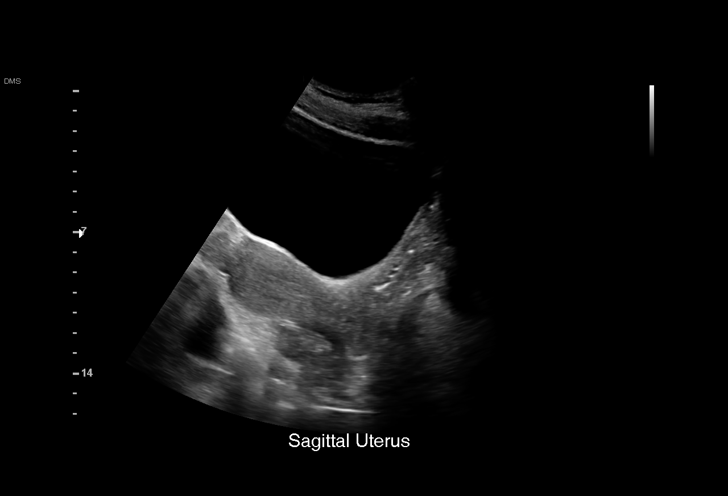
[im 9/53]
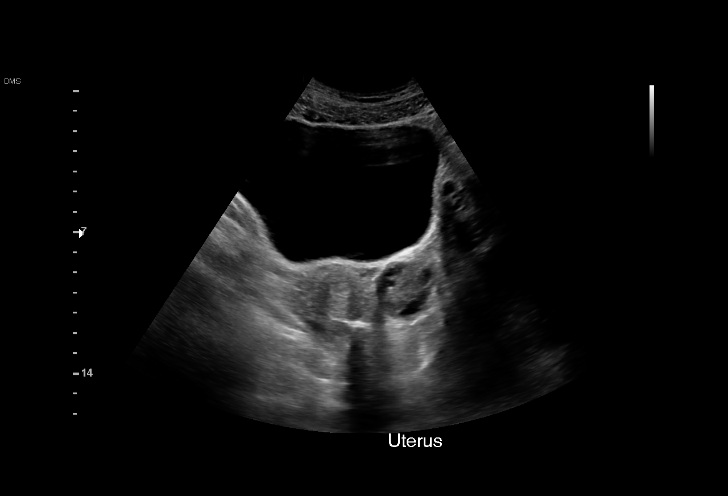
[im 11/53]
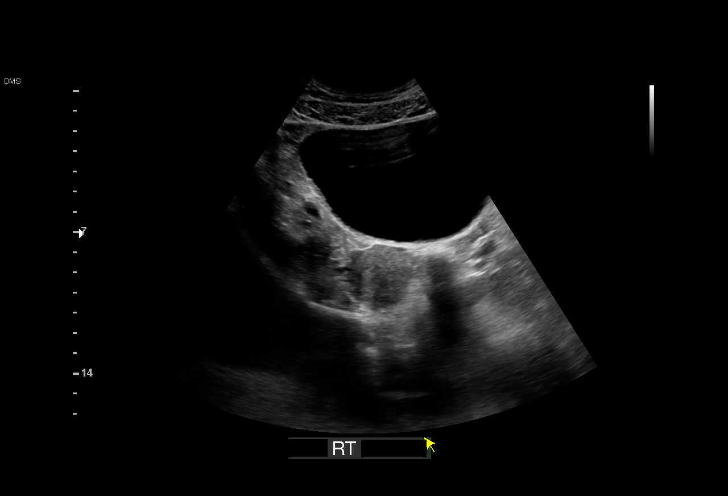
[im 16/53]
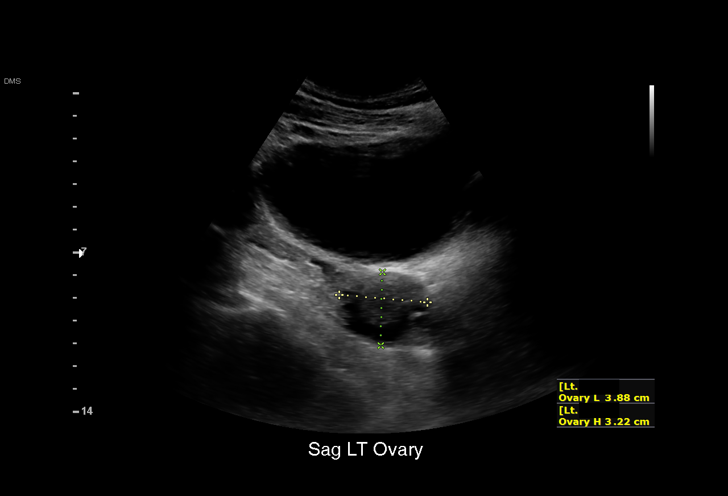
[im 20/53]
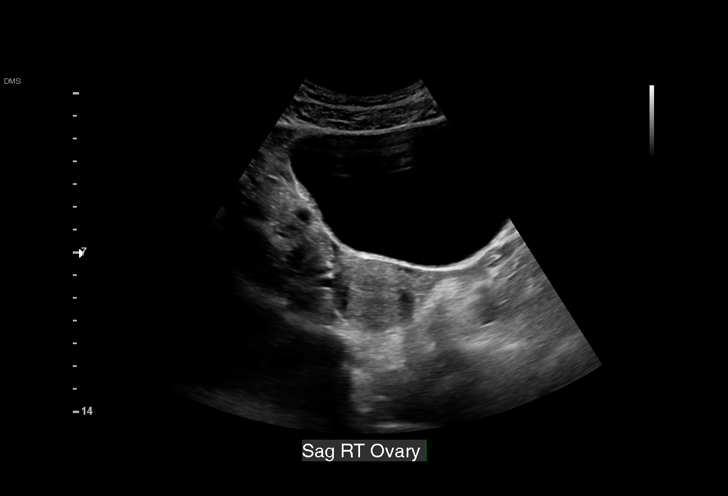
[im 22/53]
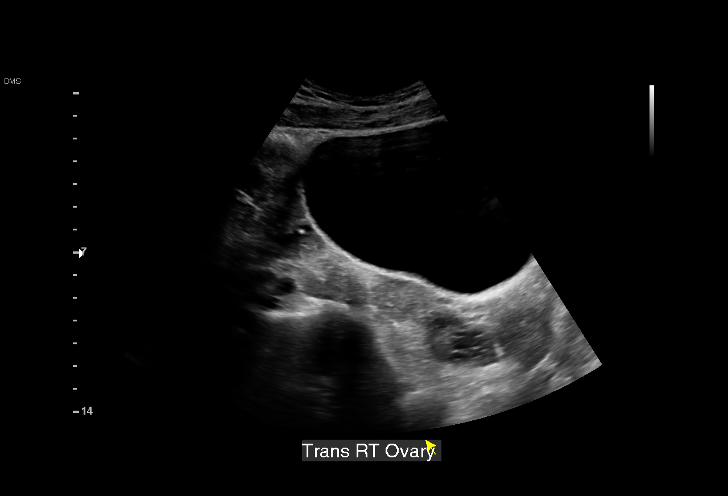
[im 27/53]
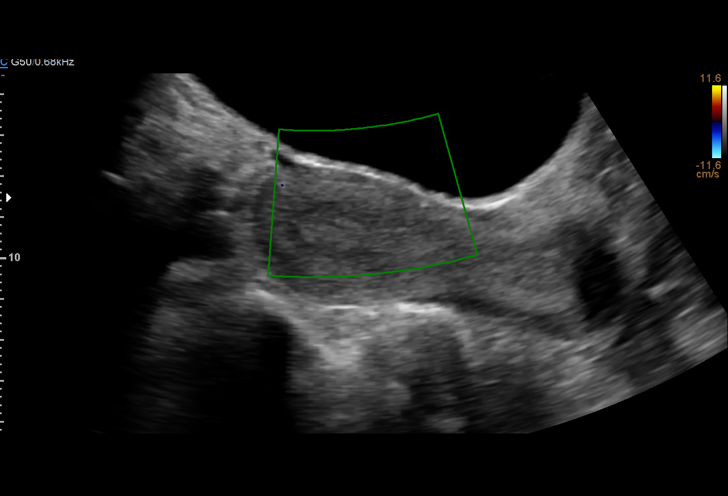
[im 31/53]
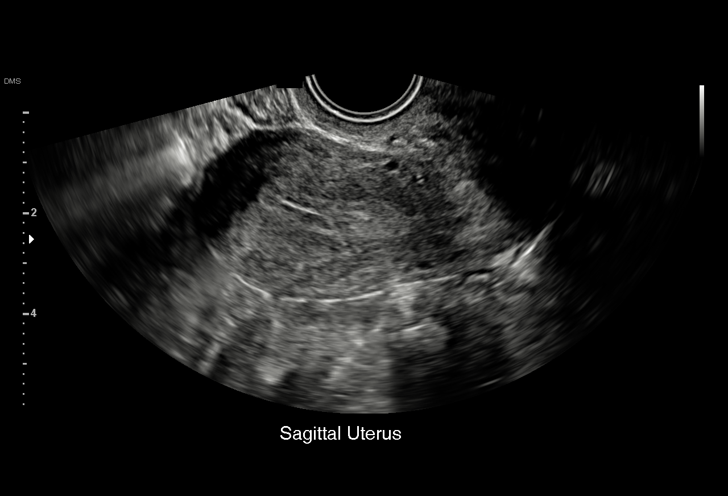
[im 33/53]
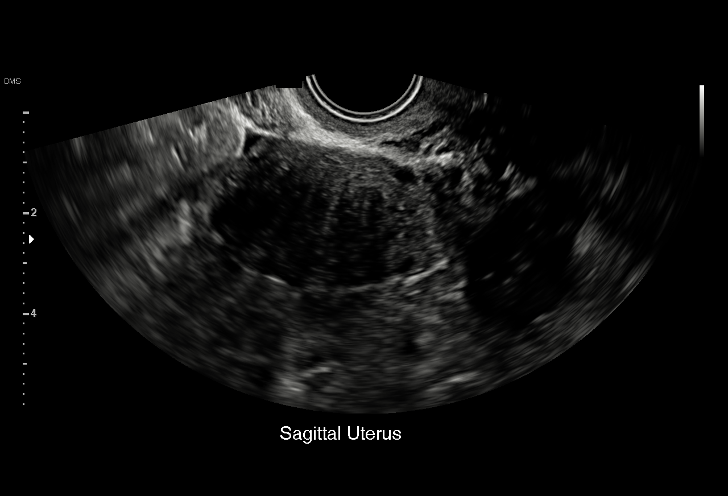
[im 37/53]
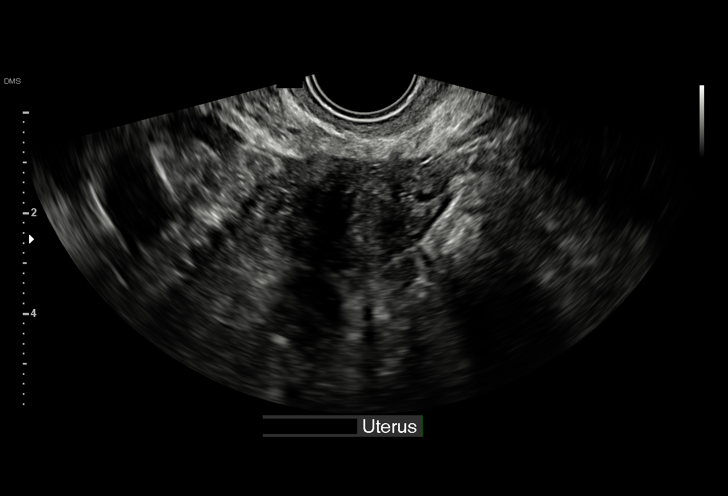
[im 42/53]
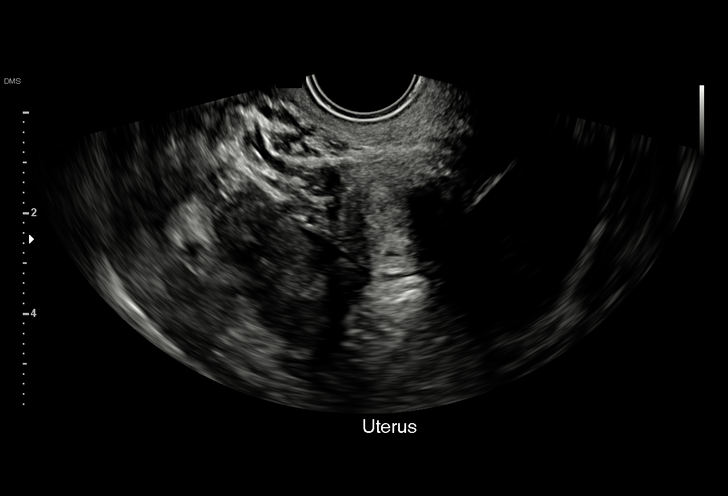
[im 44/53]
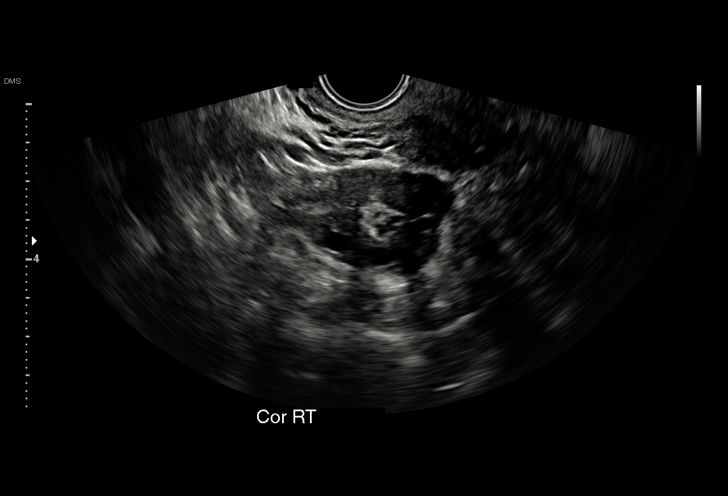
[im 48/53]
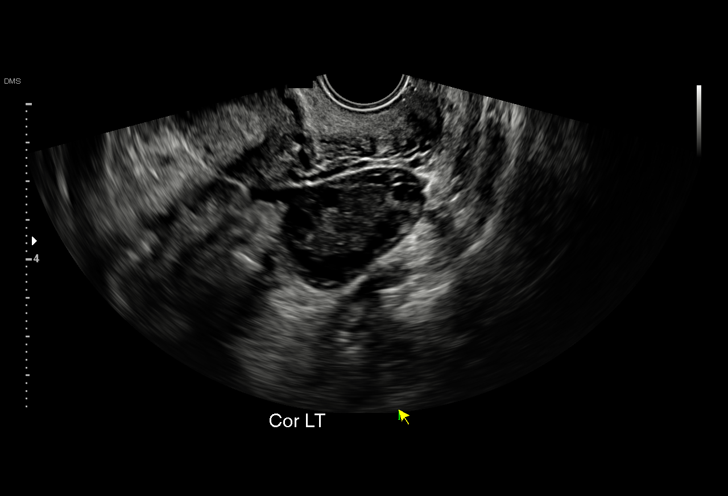
[im 53/53]
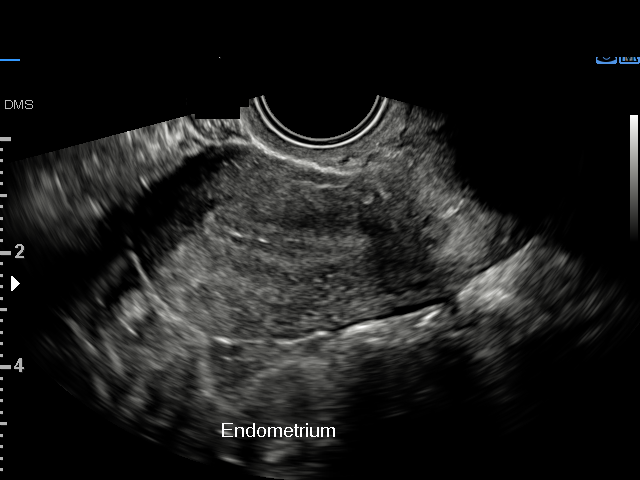

[15 of 25 positions shown; findings below may reference images not displayed]

FINDINGS: Uterus

Measurements: 8.5 x 3.4 x 4.7 cm = volume: 70.6 mL. No fibroids or
other mass visualized.

Endometrium

Thickness: 7.0.  No focal abnormality visualized.

Right ovary

Measurements: 3.95 x 2.60 x 2.65 cm = volume: 14.21 mL. Normal
appearance/no adnexal mass.

Left ovary

Measurements: 3.8 x 3.22 x 3.70 cm = volume: 24.26 mL. Normal
appearance/no adnexal mass.

Other findings

No abnormal free fluid.
IMPRESSION: 1. No focal endometrial abnormality or abnormal thickening noted. If
bleeding remains unresponsive to hormonal or medical therapy,
sonohysterogram should be considered for focal lesion work-up. (Ref:
Radiological Reasoning: Algorithmic Workup of Abnormal Vaginal
Bleeding with Endovaginal Sonography and Sonohysterography. AJR
7008; 191:S68-73)
2. Bilateral ovarian enlargement. Per 3667 consensus conference
statement by the [place] [employer] for Human Reproduction and
Embryology and the [employer] Society for Reproductive Medicine,
findings meet ultrasound diagnostic criteria for PCOS.

## 2021-01-22 DIAGNOSIS — N809 Endometriosis, unspecified: Secondary | ICD-10-CM | POA: Insufficient documentation

## 2021-01-22 DIAGNOSIS — A6000 Herpesviral infection of urogenital system, unspecified: Secondary | ICD-10-CM | POA: Insufficient documentation

## 2022-06-19 DIAGNOSIS — F319 Bipolar disorder, unspecified: Secondary | ICD-10-CM | POA: Insufficient documentation

## 2022-06-19 DIAGNOSIS — F5104 Psychophysiologic insomnia: Secondary | ICD-10-CM | POA: Insufficient documentation

## 2022-06-19 DIAGNOSIS — G43009 Migraine without aura, not intractable, without status migrainosus: Secondary | ICD-10-CM | POA: Insufficient documentation

## 2022-09-16 ENCOUNTER — Other Ambulatory Visit: Payer: Self-pay | Admitting: Obstetrics and Gynecology

## 2022-09-16 DIAGNOSIS — N979 Female infertility, unspecified: Secondary | ICD-10-CM

## 2022-09-23 ENCOUNTER — Ambulatory Visit
Admission: RE | Admit: 2022-09-23 | Discharge: 2022-09-23 | Disposition: A | Discharge status: Home / Self Care | Payer: No Typology Code available for payment source | Source: Ambulatory Visit | Attending: Obstetrics and Gynecology | Admitting: Obstetrics and Gynecology

## 2022-09-23 DIAGNOSIS — N979 Female infertility, unspecified: Secondary | ICD-10-CM

## 2023-10-14 ENCOUNTER — Ambulatory Visit: Payer: Self-pay | Admitting: Allergy

## 2023-10-14 NOTE — Progress Notes (Deleted)
 New Patient Note  RE: Gabriella Smith MRN: [id number] DOB: 05-03-1981 Date of Office Visit: 10/14/2023  Consult requested by: Dorcas Mcmurray. Susy Frizzle, FNP Primary care provider: Default, Provider, MD  Chief Complaint: No chief complaint on file.  History of Present Illness: I had the pleasure of seeing Gabriella Smith for initial evaluation at the [facility] on 10/14/2023. She is a 43 y.o. female, who is referred here by Default, Provider, MD for the evaluation of hives.  Discussed the use of AI scribe software for clinical note transcription with the patient, who gave verbal consent to proceed.  History of Present Illness             Rash started about *** ago. Mainly occurs on her ***. Describes them as ***. Individual rashes lasts about ***. No ecchymosis upon resolution. Associated symptoms include: ***.  Frequency of episodes: ***. Suspected triggers are ***. Denies any *** fevers, chills, changes in medications, foods, personal care products or recent infections. She has tried the following therapies: *** with *** benefit. Systemic steroids ***. Currently on ***.  Previous work up includes: ***. Previous history of rash/hives: ***. Patient is up to date with the following cancer screening tests: ***.  Assessment and Plan: Gabriella Smith is a 43 y.o. female with: ***  Assessment and Plan               No follow-ups on file.  No orders of the defined types were placed in this encounter.  Lab Orders  No laboratory test(s) ordered today    Other allergy screening: Asthma: {Blank single:19197::"yes","no"} Rhino conjunctivitis: {Blank single:19197::"yes","no"} Food allergy: {Blank single:19197::"yes","no"} Medication allergy: {Blank single:19197::"yes","no"} Hymenoptera allergy: {Blank single:19197::"yes","no"} Urticaria: {Blank single:19197::"yes","no"} Eczema:{Blank single:19197::"yes","no"} History of recurrent infections suggestive of  immunodeficency: {Blank single:19197::"yes","no"}  Diagnostics: Spirometry:  Tracings reviewed. Her effort: {Blank single:19197::"Good reproducible efforts.","It was hard to get consistent efforts and there is a question as to whether this reflects a maximal maneuver.","Poor effort, data can not be interpreted."} FVC: ***L FEV1: ***L, ***% predicted FEV1/FVC ratio: ***% Interpretation: {Blank single:19197::"Spirometry consistent with mild obstructive disease","Spirometry consistent with moderate obstructive disease","Spirometry consistent with severe obstructive disease","Spirometry consistent with possible restrictive disease","Spirometry consistent with mixed obstructive and restrictive disease","Spirometry uninterpretable due to technique","Spirometry consistent with normal pattern","No overt abnormalities noted given today's efforts"}.  Please see scanned spirometry results for details.  Skin Testing: {Blank single:19197::"Select foods","Environmental allergy panel","Environmental allergy panel and select foods","Food allergy panel","None","Deferred due to recent antihistamines use"}. *** Results discussed with patient/family.   Past Medical History: There are no active problems to display for this patient.  Past Medical History:  Diagnosis Date  . Fibroid   . Headache   . Herpes   . Irregular periods   . PCOS (polycystic ovarian syndrome)    Past Surgical History: Past Surgical History:  Procedure Laterality Date  . NO PAST SURGERIES     Medication List:  No current outpatient medications on file.   No current facility-administered medications for this visit.   Allergies: Allergies  Allergen Reactions  . Erythromycin Hives and Nausea And Vomiting    Got in IV and had a reactions. Has taken other antibiotics without a problem   Social History: Social History   Socioeconomic History  . Marital status: Single    Spouse name: Not on file  . Number of children: Not on  file  . Years of education: Not on file  . Highest education level: Not on file  Occupational History  .  Not on file  Tobacco Use  . Smoking status: Never  . Smokeless tobacco: Never  Vaping Use  . Vaping status: Never Used  Substance and Sexual Activity  . Alcohol use: Yes    Comment: once a month  . Drug use: No  . Sexual activity: Yes    Birth control/protection: None  Other Topics Concern  . Not on file  Social History Narrative  . Not on file   Social Drivers of Health   Financial Resource Strain: Low Risk  (09/17/2022)   Received from [facility]   Overall Financial Resource Strain (CARDIA)   . Difficulty of Paying Living Expenses: Not hard at all  Food Insecurity: No Food Insecurity (09/17/2022)   Received from [facility]   Hunger Vital Sign   . Worried About Programme researcher, broadcasting/film/video in the Last Year: Never true   . Ran Out of Food in the Last Year: Never true  Transportation Needs: No Transportation Needs (09/17/2022)   Received from [facility] - Transportation   . Lack of Transportation (Medical): No   . Lack of Transportation (Non-Medical): No  Physical Activity: Insufficiently Active (01/03/2022)   Received from [facility]   Exercise Vital Sign   . Days of Exercise per Week: 2 days   . Minutes of Exercise per Session: 40 min  Stress: Stress Concern Present (01/03/2022)   Received from [facility] - Occupational Stress Questionnaire   . Feeling of Stress : To some extent  Social Connections: Somewhat Isolated (01/03/2022)   Received from [facility]   Social Network   . How would you rate your social network (family, work, friends)?: Restricted participation with some degree of social isolation   Lives in a ***. Smoking: *** Occupation: ***  Environmental HistorySurveyor, minerals in the house: Copywriter, advertising in the family room: {Blank single:19197::"yes","no"} Carpet  in the bedroom: {Blank single:19197::"yes","no"} Heating: {Blank single:19197::"electric","gas","heat pump"} Cooling: {Blank single:19197::"central","window","heat pump"} Pet: {Blank single:19197::"yes ***","no"}  Family History: No family history on file. Problem                               Relation Asthma                                   *** Eczema                                *** Food allergy                          *** Allergic rhino conjunctivitis     ***  Review of Systems  Constitutional:  Negative for appetite change, chills, fever and unexpected weight change.  HENT:  Negative for congestion and rhinorrhea.   Eyes:  Negative for itching.  Respiratory:  Negative for cough, chest tightness, shortness of breath and wheezing.   Cardiovascular:  Negative for chest pain.  Gastrointestinal:  Negative for abdominal pain.  Genitourinary:  Negative for difficulty urinating.  Skin:  Negative for rash.  Neurological:  Negative for headaches.   Objective: There were no vitals taken for this visit. There is no height or weight on file to calculate BMI. Physical Exam Vitals and nursing  note reviewed.  Constitutional:      Appearance: Normal appearance. She is well-developed.  HENT:     Head: Normocephalic and atraumatic.     Right Ear: Tympanic membrane and external ear normal.     Left Ear: Tympanic membrane and external ear normal.     Nose: Nose normal.     Mouth/Throat:     Mouth: Mucous membranes are moist.     Pharynx: Oropharynx is clear.  Eyes:     Conjunctiva/sclera: Conjunctivae normal.  Cardiovascular:     Rate and Rhythm: Normal rate and regular rhythm.     Heart sounds: Normal heart sounds. No murmur heard.    No friction rub. No gallop.  Pulmonary:     Effort: Pulmonary effort is normal.     Breath sounds: Normal breath sounds. No wheezing, rhonchi or rales.  Musculoskeletal:     Cervical back: Neck supple.  Skin:    General: Skin is warm.      Findings: No rash.  Neurological:     Mental Status: She is alert and oriented to person, place, and time.  Psychiatric:        Behavior: Behavior normal.  The plan was reviewed with the patient/family, and all questions/concerned were addressed.  It was my pleasure to see Gabriella Smith today and participate in her care. Please feel free to contact me with any questions or concerns.  Sincerely,  Wyline Mood, DO Allergy & Immunology  [facility] office: [phone] [facility] office: [phone]

## 2023-10-20 NOTE — Progress Notes (Deleted)
 New Patient Note  RE: Gabriella Smith MRN: [id number] DOB: 05-Jan-1981 Date of Office Visit: 10/21/2023  Consult requested by: Dorcas Mcmurray. Susy Frizzle, FNP Primary care provider: Default, Provider, MD  Chief Complaint: No chief complaint on file.  History of Present Illness: I had the pleasure of seeing Gabriella Smith for initial evaluation at the [facility] on 10/20/2023. She is a 43 y.o. female, who is referred here by Default, Provider, MD for the evaluation of hives.  Discussed the use of AI scribe software for clinical note transcription with the patient, who gave verbal consent to proceed.  History of Present Illness             Rash started about *** ago. Mainly occurs on her ***. Describes them as ***. Individual rashes lasts about ***. No ecchymosis upon resolution. Associated symptoms include: ***.  Frequency of episodes: ***. Suspected triggers are ***. Denies any *** fevers, chills, changes in medications, foods, personal care products or recent infections. She has tried the following therapies: *** with *** benefit. Systemic steroids ***. Currently on ***.  Previous work up includes: ***. Previous history of rash/hives: ***. Patient is up to date with the following cancer screening tests: ***.  Assessment and Plan: Gabriella Smith is a 43 y.o. female with: ***  Assessment and Plan               No follow-ups on file.  No orders of the defined types were placed in this encounter.  Lab Orders  No laboratory test(s) ordered today    Other allergy screening: Asthma: {Blank single:19197::"yes","no"} Rhino conjunctivitis: {Blank single:19197::"yes","no"} Food allergy: {Blank single:19197::"yes","no"} Medication allergy: {Blank single:19197::"yes","no"} Hymenoptera allergy: {Blank single:19197::"yes","no"} Urticaria: {Blank single:19197::"yes","no"} Eczema:{Blank single:19197::"yes","no"} History of recurrent infections suggestive of  immunodeficency: {Blank single:19197::"yes","no"}  Diagnostics: Spirometry:  Tracings reviewed. Her effort: {Blank single:19197::"Good reproducible efforts.","It was hard to get consistent efforts and there is a question as to whether this reflects a maximal maneuver.","Poor effort, data can not be interpreted."} FVC: ***L FEV1: ***L, ***% predicted FEV1/FVC ratio: ***% Interpretation: {Blank single:19197::"Spirometry consistent with mild obstructive disease","Spirometry consistent with moderate obstructive disease","Spirometry consistent with severe obstructive disease","Spirometry consistent with possible restrictive disease","Spirometry consistent with mixed obstructive and restrictive disease","Spirometry uninterpretable due to technique","Spirometry consistent with normal pattern","No overt abnormalities noted given today's efforts"}.  Please see scanned spirometry results for details.  Skin Testing: {Blank single:19197::"Select foods","Environmental allergy panel","Environmental allergy panel and select foods","Food allergy panel","None","Deferred due to recent antihistamines use"}. *** Results discussed with patient/family.   Past Medical History: There are no active problems to display for this patient.  Past Medical History:  Diagnosis Date  . Fibroid   . Headache   . Herpes   . Irregular periods   . PCOS (polycystic ovarian syndrome)    Past Surgical History: Past Surgical History:  Procedure Laterality Date  . NO PAST SURGERIES     Medication List:  No current outpatient medications on file.   No current facility-administered medications for this visit.   Allergies: Allergies  Allergen Reactions  . Erythromycin Hives and Nausea And Vomiting    Got in IV and had a reactions. Has taken other antibiotics without a problem   Social History: Social History   Socioeconomic History  . Marital status: Single    Spouse name: Not on file  . Number of children: Not on  file  . Years of education: Not on file  . Highest education level: Not on file  Occupational History  .  Not on file  Tobacco Use  . Smoking status: Never  . Smokeless tobacco: Never  Vaping Use  . Vaping status: Never Used  Substance and Sexual Activity  . Alcohol use: Yes    Comment: once a month  . Drug use: No  . Sexual activity: Yes    Birth control/protection: None  Other Topics Concern  . Not on file  Social History Narrative  . Not on file   Social Drivers of Health   Financial Resource Strain: Low Risk  (09/11/2023)   Received from [facility]   Overall Financial Resource Strain (CARDIA)   . Difficulty of Paying Living Expenses: Not hard at all  Food Insecurity: No Food Insecurity (09/11/2023)   Received from [facility]   Hunger Vital Sign   . Worried About Programme researcher, broadcasting/film/video in the Last Year: Never true   . Ran Out of Food in the Last Year: Never true  Transportation Needs: No Transportation Needs (09/11/2023)   Received from [facility] - Transportation   . Lack of Transportation (Medical): No   . Lack of Transportation (Non-Medical): No  Physical Activity: Insufficiently Active (01/03/2022)   Received from [facility]   Exercise Vital Sign   . Days of Exercise per Week: 2 days   . Minutes of Exercise per Session: 40 min  Stress: Stress Concern Present (01/03/2022)   Received from [facility]ational [facility] - Occupational Stress Questionnaire   . Feeling of Stress : To some extent  Social Connections: Somewhat Isolated (01/03/2022)   Received from [facility]   Social Network   . How would you rate your social network (family, work, friends)?: Restricted participation with some degree of social isolation   Lives in a ***. Smoking: *** Occupation: ***  Environmental HistorySurveyor, minerals in the house: Copywriter, advertising in the family room:  {Blank single:19197::"yes","no"} Carpet in the bedroom: {Blank single:19197::"yes","no"} Heating: {Blank single:19197::"electric","gas","heat pump"} Cooling: {Blank single:19197::"central","window","heat pump"} Pet: {Blank single:19197::"yes ***","no"}  Family History: No family history on file. Problem                               Relation Asthma                                   *** Eczema                                *** Food allergy                          *** Allergic rhino conjunctivitis     ***  Review of Systems  Constitutional:  Negative for appetite change, chills, fever and unexpected weight change.  HENT:  Negative for congestion and rhinorrhea.   Eyes:  Negative for itching.  Respiratory:  Negative for cough, chest tightness, shortness of breath and wheezing.   Cardiovascular:  Negative for chest pain.  Gastrointestinal:  Negative for abdominal pain.  Genitourinary:  Negative for difficulty urinating.  Skin:  Negative for rash.  Neurological:  Negative for headaches.   Objective: There were no vitals taken for this visit. There is no height or weight on file to calculate  BMI. Physical Exam Vitals and nursing note reviewed.  Constitutional:      Appearance: Normal appearance. She is well-developed.  HENT:     Head: Normocephalic and atraumatic.     Right Ear: Tympanic membrane and external ear normal.     Left Ear: Tympanic membrane and external ear normal.     Nose: Nose normal.     Mouth/Throat:     Mouth: Mucous membranes are moist.     Pharynx: Oropharynx is clear.  Eyes:     Conjunctiva/sclera: Conjunctivae normal.  Cardiovascular:     Rate and Rhythm: Normal rate and regular rhythm.     Heart sounds: Normal heart sounds. No murmur heard.    No friction rub. No gallop.  Pulmonary:     Effort: Pulmonary effort is normal.     Breath sounds: Normal breath sounds. No wheezing, rhonchi or rales.  Musculoskeletal:     Cervical back: Neck supple.   Skin:    General: Skin is warm.     Findings: No rash.  Neurological:     Mental Status: She is alert and oriented to person, place, and time.  Psychiatric:        Behavior: Behavior normal.  The plan was reviewed with the patient/family, and all questions/concerned were addressed.  It was my pleasure to see Paulett today and participate in her care. Please feel free to contact me with any questions or concerns.  Sincerely,  Wyline Mood, DO Allergy & Immunology  [facility] office: [phone] [facility] office: [phone]

## 2023-10-21 ENCOUNTER — Ambulatory Visit: Payer: Self-pay | Admitting: Allergy

## 2023-12-18 ENCOUNTER — Other Ambulatory Visit: Payer: Self-pay

## 2023-12-18 ENCOUNTER — Encounter: Payer: Self-pay | Admitting: Allergy

## 2023-12-18 ENCOUNTER — Ambulatory Visit: Payer: Self-pay | Admitting: Allergy

## 2023-12-18 VITALS — BP 126/80 | HR 101 | Temp 98.2°F | Resp 18 | Ht 66.0 in | Wt 211.8 lb

## 2023-12-18 DIAGNOSIS — L508 Other urticaria: Secondary | ICD-10-CM

## 2023-12-18 DIAGNOSIS — J3089 Other allergic rhinitis: Secondary | ICD-10-CM | POA: Diagnosis not present

## 2023-12-18 MED ORDER — HYDROXYZINE HCL 25 MG PO TABS: 25.0000 mg | ORAL_TABLET | Freq: Three times a day (TID) | ORAL | 1 refills | Status: AC | PRN

## 2023-12-18 MED ORDER — CETIRIZINE HCL 10 MG PO TABS: 20.0000 mg | ORAL_TABLET | Freq: Two times a day (BID) | ORAL | 2 refills | Status: AC

## 2023-12-18 MED ORDER — FAMOTIDINE 20 MG PO TABS: 20.0000 mg | ORAL_TABLET | Freq: Two times a day (BID) | ORAL | 3 refills | Status: AC

## 2023-12-18 NOTE — Progress Notes (Unsigned)
 New Patient Note  RE: Gabriella Smith MRN: [id number] DOB: 01/18/1981 Date of Office Visit: 12/18/2023  Consult requested by: Serafina Damme. Jolan Natal, FNP Primary care provider: Default, Provider, MD  Chief Complaint: Urticaria (Since Jan. Not sure the cause - elevated C reactive protein and sed rate - also seeing rheumatology in July. )  History of Present Illness: I had the pleasure of seeing Gabriella Smith for initial evaluation at the [facility] on 12/18/2023. She is a 43 y.o. female, who is referred here by Default, Provider, MD for the evaluation of urticaria.  Discussed the use of AI scribe software for clinical note transcription with the patient, who gave verbal consent to proceed.  History of Present Illness             Rash started about January 2025. Mainly occurs on her arms, torso, legs. Describes them as itchy, red, raised. Individual rashes lasts about about 1 day. No ecchymosis upon resolution. Associated symptoms include: fatigue.  Frequency of episodes: daily. Suspected triggers are unknown. Denies any fevers, chills, changes in medications, foods, personal care products or recent infections. She has tried the following therapies: benadryl, Xyzal with some benefit. Systemic steroids: yes.  Previous work up includes: Feb 2025 elevated sed rate, crp. ANA, alpha gal, TSH negative.  Environmental panel was positive to grass. Previous history of rash/hives: none. Patient is up to date with the following cancer screening tests: physical exam, pap smears. Not up to date with pap smears.   12/10/2023 PCP visit: "She reports a rash on her hands and arms that has been present for approximately 4.5 months, extending to her eye area. She sought medical attention twice, initially through a tele-visit with the emergency room and subsequently with an in-person visit. During these visits, she was administered an EpiPen, intravenous steroids, famotidine, and Benadryl,  which provided temporary relief, but the symptoms recurred after a day or two. She was referred to an allergist by her nurse practitioner. However, a second nurse practitioner suggested a potential diagnosis of lupus, prompting her to seek a referral to a rheumatologist. She also reports episodes of unexplained fever and disorientation, including one instance where she felt cold and achy after a shower, despite the water being warm. Her body temperature normalized around 3:00 AM, allowing her to sleep, but she experienced some sleep loss due to these symptoms.  She experiences swelling in her fingers and ankles, joint pain during breakouts, and severe fatigue. Currently, she is experiencing a significant flare-up, with rashes on her back, face, and legs, and severe aching in her thumb and hand joints. She has had three severe episodes in the past 4 to 5 months that required medical intervention. She expresses fear about using the EpiPen due to the need for hospital follow-up but acknowledges that the only time she experienced significant relief was during her hospital visit."  12/17/2023 ER visit: "Differential diagnosis: Chronic rash, allergic reaction, rheumatoid arthritis, other autoimmune disease  43 year old female presents with concerns for a intermittent full-body rash with intermittent hives and reported joint pain that has been ongoing since January 18 of this year. Patient reports that treatment with prednisone, Benadryl and Pepcid oral or IV does suppress the rash and associated symptoms temporarily. She missed an appointment with an allergist but has rescheduled for next month. She is also concerned that this may be rheumatologic.  - Sedimentation rate resulted elevated at 92. CBC and chemistries were overall unremarkable"  Assessment and Plan: Gabriella Smith  is a 43 y.o. female with: ***  Assessment and Plan               No follow-ups on file.  No orders of the defined types were  placed in this encounter.  Lab Orders  No laboratory test(s) ordered today    Other allergy screening: Asthma: no Rhino conjunctivitis:  Denies any significant symptoms. 2025 labs positive to grass.  Food allergy: no Medication allergy: yes Hymenoptera allergy: no Eczema:no History of recurrent infections suggestive of immunodeficency: no  Diagnostics: None.    Past Medical History: Patient Active Problem List   Diagnosis Date Noted   Bipolar 1 disorder (HCC) 06/19/2022   Migraine without aura and without status migrainosus, not intractable 06/19/2022   Psychophysiological insomnia 06/19/2022   Endometriosis, unspecified 01/22/2021   Genital herpes simplex 01/22/2021   Past Medical History:  Diagnosis Date   Angio-edema    Fibroid    Headache    Herpes    Irregular periods    PCOS (polycystic ovarian syndrome)    Urticaria    Past Surgical History: Past Surgical History:  Procedure Laterality Date   NO PAST SURGERIES     Medication List:  Current Outpatient Medications  Medication Sig Dispense Refill   ARIPiprazole (ABILIFY) 5 MG tablet Take 1 tablet by mouth daily.     EPINEPHrine 0.3 mg/0.3 mL IJ SOAJ injection Inject 0.3 mg into the muscle as needed for anaphylaxis.     famotidine (PEPCID) 40 MG tablet Take 40 mg by mouth daily.     traZODone (DESYREL) 50 MG tablet Take 1 tablet by mouth at bedtime as needed.     valACYclovir (VALTREX) 500 MG tablet Take 1 tablet by mouth daily.     hydrOXYzine (ATARAX) 25 MG tablet Take 25 mg by mouth 3 (three) times daily. (Patient not taking: Reported on 12/18/2023)     No current facility-administered medications for this visit.   Allergies: Allergies  Allergen Reactions   Azithromycin Dermatitis, Nausea And Vomiting and Nausea Only   Erythromycin Hives and Nausea And Vomiting    Got in IV and had a reactions. Has taken other antibiotics without a problem   Social History: Social History   Socioeconomic  History   Marital status: Single    Spouse name: Not on file   Number of children: Not on file   Years of education: Not on file   Highest education level: Not on file  Occupational History   Not on file  Tobacco Use   Smoking status: Never    Passive exposure: Current   Smokeless tobacco: Never  Vaping Use   Vaping status: Never Used  Substance and Sexual Activity   Alcohol use: Yes    Comment: once a month   Drug use: No   Sexual activity: Yes    Birth control/protection: None  Other Topics Concern   Not on file  Social History Narrative   Not on file   Social Drivers of Health   Financial Resource Strain: Low Risk  (12/10/2023)   Received from [facility]   Overall Financial Resource Strain (CARDIA)    Difficulty of Paying Living Expenses: Not hard at all  Food Insecurity: No Food Insecurity (12/10/2023)   Received from [facility]   Hunger Vital Sign    Worried About Running Out of Food in the Last Year: Never true    Ran Out of Food in the Last Year: Never true  Transportation Needs: No Transportation  Needs (12/10/2023)   Received from [facility]   PRAPARE - Transportation    Lack of Transportation (Medical): No    Lack of Transportation (Non-Medical): No  Physical Activity: Unknown (12/10/2023)   Received from [facility]   Exercise Vital Sign    Days of Exercise per Week: 0 days    Minutes of Exercise per Session: Not on file  Stress: Stress Concern Present (12/10/2023)   Received from [facility] of Occupational Health - Occupational Stress Questionnaire    Feeling of Stress : To some extent  Social Connections: Socially Integrated (12/10/2023)   Received from [facility]   Social Network    How would you rate your social network (family, work, friends)?: Good participation with social networks   Lives in a house. Smoking: denies Occupation: work from [employer] History: Immunologist in the house: no Engineer, civil (consulting) in  the family room: no Carpet in the bedroom: no Heating: electric Cooling: central Pet: no  Family History: History reviewed. No pertinent family history. Problem                               Relation Asthma                                   no Eczema                                no Food allergy                          no Allergic rhino conjunctivitis     no Insect stings   No   Review of Systems  Constitutional:  Positive for fatigue. Negative for appetite change, chills, fever and unexpected weight change.  HENT:  Negative for congestion and rhinorrhea.   Eyes:  Negative for itching.  Respiratory:  Negative for cough, chest tightness, shortness of breath and wheezing.   Cardiovascular:  Negative for chest pain.  Gastrointestinal:  Negative for abdominal pain.  Genitourinary:  Negative for difficulty urinating.  Skin:  Positive for rash.  Neurological:  Negative for headaches.    Objective: BP 126/80 (BP Location: Right Arm, Patient Position: Sitting, Cuff Size: Normal)   Pulse (!) 101   Temp 98.2 F (36.8 C) (Temporal)   Resp 18   Ht 5\' 6"  (1.676 m)   Wt 211 lb 12.8 oz (96.1 kg)   SpO2 97%   BMI 34.19 kg/m  Body mass index is 34.19 kg/m. Physical Exam Vitals and nursing note reviewed.  Constitutional:      Appearance: Normal appearance. She is well-developed.  HENT:     Head: Normocephalic and atraumatic.     Right Ear: Tympanic membrane and external ear normal.     Left Ear: Tympanic membrane and external ear normal.     Nose: Congestion and rhinorrhea present.     Mouth/Throat:     Mouth: Mucous membranes are moist.     Pharynx: Oropharynx is clear.  Eyes:     Conjunctiva/sclera: Conjunctivae normal.  Cardiovascular:     Rate and Rhythm: Normal rate and regular rhythm.     Heart sounds: Normal heart sounds. No murmur heard.    No friction rub. No gallop.  Pulmonary:     Effort: Pulmonary effort is normal.     Breath sounds: Normal breath sounds. No  wheezing, rhonchi or rales.  Musculoskeletal:     Cervical back: Neck supple.  Skin:    General: Skin is warm.     Findings: No rash.  Neurological:     Mental Status: She is alert and oriented to person, place, and time.  Psychiatric:        Behavior: Behavior normal.    The plan was reviewed with the patient/family, and all questions/concerned were addressed.  It was my pleasure to see Gabriella Smith today and participate in her care. Please feel free to contact me with any questions or concerns.  Sincerely,  Eudelia Hero, DO Allergy & Immunology  [facility]  [place] office: [phone] [facility] office: [phone]

## 2023-12-18 NOTE — Patient Instructions (Addendum)
 Hives: Based on clinical history, she likely has chronic idiopathic urticaria. Discussed with patient, that urticaria is usually caused by release of histamine by cutaneous mast cells but sometimes it is non-histamine mediated. Explained that urticaria is not always associated with allergies. In most cases, the exact etiology for urticaria can not be established and it is considered idiopathic. Discussed the role of adding omalizumab  (anti-IgE antibodies) in controlling urticaria in refractory patients. Informational pamphlet given.  Start zyrtec 10mg  to 20mg  up to twice a day.  Take this instead of xyzal.  If symptoms are not controlled or causes drowsiness let us  know. Start Pepcid (famotidine) 20mg  twice a day.  Avoid the following potential triggers: alcohol, tight clothing, NSAIDs, hot showers and getting overheated. See below for proper skin care.  Take hydroxyzine 25mg  three times a day as needed for breakthrough hives.   Get bloodwork. We are ordering labs, so please allow 1-2 weeks for the results to come back. With the newly implemented Cures Act, the labs might be visible to you at the same time that they become visible to me. However, I will not address the results until all of the results are back, so please be patient.   Environmental allergies Your bloodwork was positive to grass pollen. See below for environmental control measures.   -ELEVATED INFLAMMATORY MARKERS: Elevated inflammatory markers, such as C-reactive protein and ESR, indicate inflammation in your body. The exact cause is unclear, so it is important to keep your rheumatology appointment for further evaluation.  Return in about 4 weeks (around 01/15/2024). Or sooner if needed.  Reducing Pollen Exposure Pollen seasons: trees (spring), grass (summer) and ragweed/weeds (fall). Keep windows closed in your home and car to lower pollen exposure.  Install air conditioning in the bedroom and throughout the house if  possible.  Avoid going out in dry windy days - especially early morning. Pollen counts are highest between 5 - 10 AM and on dry, hot and windy days.  Save outside activities for late afternoon or after a heavy rain, when pollen levels are lower.  Avoid mowing of grass if you have grass pollen allergy. Be aware that pollen can also be transported indoors on people and pets.  Dry your clothes in an automatic dryer rather than hanging them outside where they might collect pollen.  Rinse hair and eyes before bedtime.  Skin care recommendations  Bath time: Always use lukewarm water. AVOID very hot or cold water. Keep bathing time to 5-10 minutes. Do NOT use bubble bath. Use a mild soap and use just enough to wash the dirty areas. Do NOT scrub skin vigorously.  After bathing, pat dry your skin with a towel. Do NOT rub or scrub the skin.  Moisturizers and prescriptions:  ALWAYS apply moisturizers immediately after bathing (within 3 minutes). This helps to lock-in moisture. Use the moisturizer several times a day over the whole body. Good summer moisturizers include: Aveeno, CeraVe, Cetaphil. Good winter moisturizers include: Aquaphor, Vaseline, Cerave, Cetaphil, Eucerin, Vanicream. When using moisturizers along with medications, the moisturizer should be applied about one hour after applying the medication to prevent diluting effect of the medication or moisturize around where you applied the medications. When not using medications, the moisturizer can be continued twice daily as maintenance.  Laundry and clothing: Avoid laundry products with added color or perfumes. Use unscented hypo-allergenic laundry products such as Tide free, Cheer free & gentle, and All free and clear.  If the skin still seems dry or sensitive,  you can try double-rinsing the clothes. Avoid tight or scratchy clothing such as wool. Do not use fabric softeners or dyer sheets.

## 2023-12-19 ENCOUNTER — Encounter: Payer: Self-pay | Admitting: Allergy

## 2023-12-26 ENCOUNTER — Ambulatory Visit: Payer: Self-pay | Admitting: Allergy

## 2023-12-26 LAB — C3 AND C4
Complement C3, Serum: 210 mg/dL — ABNORMAL HIGH (ref 82–167)
Complement C4, Serum: 28 mg/dL (ref 12–38)

## 2023-12-26 LAB — PROTEIN ELECTROPHORESIS, URINE REFLEX
Albumin ELP, Urine: 22.7 %
Alpha-1-Globulin, U: 1.5 %
Alpha-2-Globulin, U: 20.2 %
Beta Globulin, U: 35.3 %
Gamma Globulin, U: 20.2 %
Protein, Ur: 12.1 mg/dL

## 2023-12-26 LAB — PROTEIN ELECTROPHORESIS, SERUM
A/G Ratio: 0.9 (ref 0.7–1.7)
Albumin ELP: 3.6 g/dL (ref 2.9–4.4)
Alpha 1: 0.3 g/dL (ref 0.0–0.4)
Alpha 2: 0.9 g/dL (ref 0.4–1.0)
Beta: 1.3 g/dL (ref 0.7–1.3)
Gamma Globulin: 1.6 g/dL (ref 0.4–1.8)
Globulin, Total: 4 g/dL — ABNORMAL HIGH (ref 2.2–3.9)
Total Protein: 7.6 g/dL (ref 6.0–8.5)

## 2023-12-26 LAB — TRYPTASE: Tryptase: 3.3 ug/L (ref 2.2–13.2)

## 2023-12-26 LAB — CHRONIC URTICARIA: cu index: <3.4 (ref <10)

## 2024-03-04 ENCOUNTER — Other Ambulatory Visit: Payer: Self-pay | Admitting: Obstetrics and Gynecology

## 2024-03-04 DIAGNOSIS — N61 Mastitis without abscess: Secondary | ICD-10-CM

## 2024-03-04 DIAGNOSIS — S21032A Puncture wound without foreign body of left breast, initial encounter: Secondary | ICD-10-CM

## 2024-03-10 ENCOUNTER — Ambulatory Visit
Admission: RE | Admit: 2024-03-10 | Discharge: 2024-03-10 | Disposition: A | Discharge status: Home / Self Care | Source: Ambulatory Visit | Attending: Obstetrics and Gynecology | Admitting: Obstetrics and Gynecology

## 2024-03-10 ENCOUNTER — Ambulatory Visit

## 2024-03-10 DIAGNOSIS — S21032A Puncture wound without foreign body of left breast, initial encounter: Secondary | ICD-10-CM

## 2024-03-10 DIAGNOSIS — N61 Mastitis without abscess: Secondary | ICD-10-CM

## 2024-03-24 ENCOUNTER — Other Ambulatory Visit: Payer: Self-pay | Admitting: Obstetrics and Gynecology

## 2024-05-05 ENCOUNTER — Telehealth: Admitting: Physician Assistant

## 2024-05-05 DIAGNOSIS — J069 Acute upper respiratory infection, unspecified: Secondary | ICD-10-CM | POA: Diagnosis not present

## 2024-05-05 NOTE — Patient Instructions (Signed)
  Gabriella Smith, thank you for joining Lovette Borg, PA-C for today's virtual visit.  While this provider is not your primary care provider (PCP), if your PCP is located in our provider database this encounter information will be shared with them immediately following your visit.   A [facility] MyChart account gives you access to today's visit and all your visits, tests, and labs performed at [facility]  click here if you don't have a [facility] MyChart account or go to [email or web address]  Consent: (Patient) Gabriella Smith provided verbal consent for this virtual visit at the beginning of the encounter.  Current Medications:  Current Outpatient Medications:    ARIPiprazole (ABILIFY) 5 MG tablet, Take 1 tablet by mouth daily., Disp: , Rfl:    cetirizine  (ZYRTEC  ALLERGY) 10 MG tablet, Take 2 tablets (20 mg total) by mouth 2 (two) times daily., Disp: 120 tablet, Rfl: 2   EPINEPHrine 0.3 mg/0.3 mL IJ SOAJ injection, Inject 0.3 mg into the muscle as needed for anaphylaxis., Disp: , Rfl:    famotidine  (PEPCID ) 20 MG tablet, Take 1 tablet (20 mg total) by mouth 2 (two) times daily., Disp: 60 tablet, Rfl: 3   hydrOXYzine  (ATARAX ) 25 MG tablet, Take 1 tablet (25 mg total) by mouth 3 (three) times daily as needed for itching (breakthrough hives)., Disp: 30 tablet, Rfl: 1   traZODone (DESYREL) 50 MG tablet, Take 1 tablet by mouth at bedtime as needed., Disp: , Rfl:    valACYclovir (VALTREX) 500 MG tablet, Take 1 tablet by mouth daily., Disp: , Rfl:    Medications ordered in this encounter:  No orders of the defined types were placed in this encounter.    *If you need refills on other medications prior to your next appointment, please contact your pharmacy*  Follow-Up: Call back or seek an in-person evaluation if the symptoms worsen or if the condition fails to improve as anticipated.  [facility] [phone]  Other Instructions Stay well hydrated with  clear liquids Take otc medicines for cold as directed on the box. Watch for worsening symptoms. Schedule a virtual appointment or follow up at an urgent care clinic if symptoms don't improve.    If you have been instructed to have an in-person evaluation today at a local Urgent Care facility, please use the link below. It will take you to a list of all of our available [facility], including address, phone number and hours of operation. Please do not delay care.  [facility]  If you or a family member do not have a primary care provider, use the link below to schedule a visit and establish care. When you choose a [facility] primary care physician or advanced practice provider, you gain a long-term partner in health. Find a Primary Care Provider  Learn more about [facility] in-office and virtual care options: [facility] - Get Care Now

## 2024-05-05 NOTE — Progress Notes (Signed)
 Virtual Visit Consent   Gabriella Smith, you are scheduled for a virtual visit with a  provider today. Just as with appointments in the office, your consent must be obtained to participate. Your consent will be active for this visit and any virtual visit you may have with one of our providers in the next 365 days. If you have a MyChart account, a copy of this consent can be sent to you electronically.  As this is a virtual visit, video technology does not allow for your provider to perform a traditional examination. This may limit your provider's ability to fully assess your condition. If your provider identifies any concerns that need to be evaluated in person or the need to arrange testing (such as labs, EKG, etc.), we will make arrangements to do so. Although advances in technology are sophisticated, we cannot ensure that it will always work on either your end or our end. If the connection with a video visit is poor, the visit may have to be switched to a telephone visit. With either a video or telephone visit, we are not always able to ensure that we have a secure connection.  By engaging in this virtual visit, you consent to the provision of healthcare and authorize for your insurance to be billed (if applicable) for the services provided during this visit. Depending on your insurance coverage, you may receive a charge related to this service.  I need to obtain your verbal consent now. Are you willing to proceed with your visit today? Gabriella Smith has provided verbal consent on 05/05/2024 for a virtual visit (video or telephone). Gabriella Borg, PA-C  Date: 05/05/2024 6:21 PM   Virtual Visit via Video Note   I, Gabriella Smith, connected with  Gabriella Smith  ([id number], 1981-01-06) on 05/05/24 at  6:15 PM EDT by a video-enabled telemedicine application and verified that I am speaking with the correct person using two identifiers.  Location: Patient: Virtual Visit Location Patient:  Home Provider: Virtual Visit Location Provider: Home Office   I discussed the limitations of evaluation and management by telemedicine and the availability of in person appointments. The patient expressed understanding and agreed to proceed.    History of Present Illness: Gabriella Smith is a 43 y.o. who identifies as a female who was assigned female at birth, and is being seen today for uri symptoms.  HPI: 42y/o F presents for a telehealth video visit for c/o  sore throat, headache, low grade fever x 2-3 days. Taken otc medicine. Fever is slowly resolving. Not tested for covid.   URI     Problems:  Patient Active Problem List   Diagnosis Date Noted   Bipolar 1 disorder (HCC) 06/19/2022   Migraine without aura and without status migrainosus, not intractable 06/19/2022   Psychophysiological insomnia 06/19/2022   Endometriosis, unspecified 01/22/2021   Genital herpes simplex 01/22/2021    Allergies:  Allergies  Allergen Reactions   Azithromycin Dermatitis, Nausea And Vomiting and Nausea Only   Erythromycin Hives and Nausea And Vomiting    Got in IV and had a reactions. Has taken other antibiotics without a problem   Medications:  Current Outpatient Medications:    ARIPiprazole (ABILIFY) 5 MG tablet, Take 1 tablet by mouth daily., Disp: , Rfl:    cetirizine  (ZYRTEC  ALLERGY) 10 MG tablet, Take 2 tablets (20 mg total) by mouth 2 (two) times daily., Disp: 120 tablet, Rfl: 2   EPINEPHrine 0.3 mg/0.3 mL IJ SOAJ injection, Inject 0.3  mg into the muscle as needed for anaphylaxis., Disp: , Rfl:    famotidine  (PEPCID ) 20 MG tablet, Take 1 tablet (20 mg total) by mouth 2 (two) times daily., Disp: 60 tablet, Rfl: 3   hydrOXYzine  (ATARAX ) 25 MG tablet, Take 1 tablet (25 mg total) by mouth 3 (three) times daily as needed for itching (breakthrough hives)., Disp: 30 tablet, Rfl: 1   traZODone (DESYREL) 50 MG tablet, Take 1 tablet by mouth at bedtime as needed., Disp: , Rfl:    valACYclovir  (VALTREX) 500 MG tablet, Take 1 tablet by mouth daily., Disp: , Rfl:   Observations/Objective: Patient is well-developed, well-nourished in no acute distress.  Resting comfortably  at home.  Head is normocephalic, atraumatic.  No labored breathing.  Speech is clear and coherent with logical content.  Patient is alert and oriented at baseline.    Assessment and Plan: 1. Viral upper respiratory tract infection (Primary)  Stay well hydrated with clear liquids Take otc medicines for cold as directed on the box. Watch for worsening symptoms. Schedule a virtual appointment or follow up at an urgent care clinic if symptoms don't improve.  Requested a work excuse. Pt verbalized understanding and in agreement.    Follow Up Instructions: I discussed the assessment and treatment plan with the patient. The patient was provided an opportunity to ask questions and all were answered. The patient agreed with the plan and demonstrated an understanding of the instructions.  A copy of instructions were sent to the patient via MyChart unless otherwise noted below.   Patient has requested to receive PHI (AVS, Work Notes, etc) pertaining to this video visit through e-mail as they are currently without active MyChart. They have voiced understand that email is not considered secure and their health information could be viewed by someone other than the patient.   The patient was advised to call back or seek an in-person evaluation if the symptoms worsen or if the condition fails to improve as anticipated.    Gabriella Wiltgen, PA-C

## 2024-07-02 ENCOUNTER — Telehealth: Admitting: Family Medicine

## 2024-07-02 DIAGNOSIS — M2550 Pain in unspecified joint: Secondary | ICD-10-CM

## 2024-07-02 MED ORDER — NAPROXEN 500 MG PO TABS: 500.0000 mg | ORAL_TABLET | Freq: Two times a day (BID) | ORAL | 0 refills | Status: AC

## 2024-07-02 NOTE — Patient Instructions (Signed)
 Joint Pain  Joint pain can be caused by many things. It may go away if you follow instructions from your health care provider for taking care of yourself at home. Sometimes, you may need more treatment. Joint pain can be caused by: Bruises at the area of the joint. An injury caused by movements that are repeated. Wear and tear on the joint as you get older. Buildup of uric acid crystals in the joint. This is also called gout. Irritation and swelling of the joint. Types of arthritis. Infections of the joint or of the bone. Your provider may tell you to take pain medicine or wear an elastic bandage, sling, or splint. If your joint pain continues, you may need lab or imaging tests to find the cause of your joint pain. Follow these instructions at home: If you have an elastic bandage, sling, or splint that can be taken off: Wear the bandage, sling, or splint as told by your provider. Take it off only if your provider says you can. Check the skin under and around it every day. Tell your provider if you see problems. Loosen it if your fingers or toes tingle, are numb, or turn cold and blue. Keep it clean and dry. Ask your provider if you should remove it before bathing. If the bandage, sling, or splint is not waterproof: Do not let it get wet. Cover it when you take a bath or shower. Use a cover that does not let any water in. Managing pain, stiffness, and swelling     If told, put ice on the area. If you have an elastic bandage, sling, or splint that you can take off, remove it as told. Put ice in a plastic bag. Place a towel between your skin and the bag. Leave the ice on for 20 minutes, 2-3 times a day. If told, put heat on the area. Do this as often as told. Use the heat source that your provider recommends, such as a moist heat pack or a heating pad. Place a towel between your skin and the heat source. Leave the heat on for 20-30 minutes. If your skin turns bright red, take off the  ice or heat right away to prevent skin damage. The risk of damage is higher if you can't feel pain, heat, or cold. Move your fingers or toes often to reduce stiffness and swelling. Raise the injured area above the level of your heart while you're sitting or lying down. Use a pillow to support the painful area as needed. Activity Rest the painful joint as told. Do not do things that cause pain or make pain worse. Begin exercising or stretching the affected area as told by your provider. Return to normal activities when you are told. Ask what things are safe for you to do. General instructions Take your medicines as told by your provider. Treatment may include medicines for pain and swelling that are taken by mouth or applied to the skin. Do not smoke, vape, or use products with nicotine or tobacco in them. If you need help quitting, talk with your provider. Keep all follow-up visits. Your provider will want to check on your condition. Contact a health care provider if: You have pain that does not get better with medicine. Your joint pain does not improve within 3 days. You have more bruising or swelling. You have a fever. You lose 10 lb (4.5 kg) or more without trying. Get help right away if: You cannot move the joint. Your fingers  or toes tingle, become numb, or turn cold and blue. You have a fever along with a joint that's red, warm, and swollen. This information is not intended to replace advice given to you by your health care provider. Make sure you discuss any questions you have with your health care provider. Document Revised: 04/24/2023 Document Reviewed: 10/04/2022 Elsevier Patient Education  2024 [employer].

## 2024-07-02 NOTE — Progress Notes (Signed)
 Virtual Visit Consent   Gabriella Smith, you are scheduled for a virtual visit with a [facility] provider today. Just as with appointments in the office, your consent must be obtained to participate. Your consent will be active for this visit and any virtual visit you may have with one of our providers in the next 365 days. If you have a MyChart account, a copy of this consent can be sent to you electronically.  As this is a virtual visit, video technology does not allow for your provider to perform a traditional examination. This may limit your provider's ability to fully assess your condition. If your provider identifies any concerns that need to be evaluated in person or the need to arrange testing (such as labs, EKG, etc.), we will make arrangements to do so. Although advances in technology are sophisticated, we cannot ensure that it will always work on either your end or our end. If the connection with a video visit is poor, the visit may have to be switched to a telephone visit. With either a video or telephone visit, we are not always able to ensure that we have a secure connection.  By engaging in this virtual visit, you consent to the provision of healthcare and authorize for your insurance to be billed (if applicable) for the services provided during this visit. Depending on your insurance coverage, you may receive a charge related to this service.  I need to obtain your verbal consent now. Are you willing to proceed with your visit today? Gabriella Smith has provided verbal consent on 07/02/2024 for a virtual visit (video or telephone). Gabriella Lamp, FNP  Date: 07/02/2024 12:08 PM   Virtual Visit via Video Note   I, Gabriella Smith, connected with  Gabriella Smith  ([id number], 09-06-1980) on 07/02/24 at 12:00 PM EST by a video-enabled telemedicine application and verified that I am speaking with the correct person using two identifiers.  Location: Patient: Virtual Visit Location Patient:  Home Provider: Virtual Visit Location Provider: Home Office   I discussed the limitations of evaluation and management by telemedicine and the availability of in person appointments. The patient expressed understanding and agreed to proceed.    History of Present Illness: Gabriella Smith is a 43 y.o. who identifies as a female who was assigned female at birth, and is being seen today for joint pain, BP 93/54, glucose 76, on hydroxycloroquine  HPI: HPI  Problems:  Patient Active Problem List   Diagnosis Date Noted   Bipolar 1 disorder (HCC) 06/19/2022   Migraine without aura and without status migrainosus, not intractable 06/19/2022   Psychophysiological insomnia 06/19/2022   Endometriosis, unspecified 01/22/2021   Genital herpes simplex 01/22/2021    Allergies:  Allergies  Allergen Reactions   Azithromycin Dermatitis, Nausea And Vomiting and Nausea Only   Erythromycin Hives and Nausea And Vomiting    Got in IV and had a reactions. Has taken other antibiotics without a problem   Medications:  Current Outpatient Medications:    ARIPiprazole (ABILIFY) 5 MG tablet, Take 1 tablet by mouth daily., Disp: , Rfl:    cetirizine  (ZYRTEC  ALLERGY) 10 MG tablet, Take 2 tablets (20 mg total) by mouth 2 (two) times daily., Disp: 120 tablet, Rfl: 2   EPINEPHrine 0.3 mg/0.3 mL IJ SOAJ injection, Inject 0.3 mg into the muscle as needed for anaphylaxis., Disp: , Rfl:    famotidine  (PEPCID ) 20 MG tablet, Take 1 tablet (20 mg total) by mouth 2 (two) times daily.,  Disp: 60 tablet, Rfl: 3   hydrOXYzine  (ATARAX ) 25 MG tablet, Take 1 tablet (25 mg total) by mouth 3 (three) times daily as needed for itching (breakthrough hives)., Disp: 30 tablet, Rfl: 1   traZODone (DESYREL) 50 MG tablet, Take 1 tablet by mouth at bedtime as needed., Disp: , Rfl:    valACYclovir (VALTREX) 500 MG tablet, Take 1 tablet by mouth daily., Disp: , Rfl:   Observations/Objective: Patient is well-developed, well-nourished in no  acute distress.  Resting comfortably  at home.  Head is normocephalic, atraumatic.  No labored breathing.  Speech is clear and coherent with logical content.  Patient is alert and oriented at baseline.    Assessment and Plan: 1. Arthralgia, unspecified joint (Primary)  Heat, stay warm, take meds with food, follow up with pcp.   Follow Up Instructions: I discussed the assessment and treatment plan with the patient. The patient was provided an opportunity to ask questions and all were answered. The patient agreed with the plan and demonstrated an understanding of the instructions.  A copy of instructions were sent to the patient via MyChart unless otherwise noted below.     The patient was advised to call back or seek an in-person evaluation if the symptoms worsen or if the condition fails to improve as anticipated.    Gabriella Cea, FNP
